# Patient Record
Sex: Male | Born: 1965 | Race: White | Hispanic: No | Marital: Married | Smoking: Never smoker
Health system: Southern US, Community
[De-identification: ages and names within clinical notes are randomized; demographics above are authoritative.]

## PROBLEM LIST (undated history)

## (undated) DIAGNOSIS — E785 Hyperlipidemia, unspecified: Secondary | ICD-10-CM

## (undated) DIAGNOSIS — I2510 Atherosclerotic heart disease of native coronary artery without angina pectoris: Secondary | ICD-10-CM

## (undated) HISTORY — PX: TONSILLECTOMY: SUR1361

## (undated) HISTORY — PX: APPENDECTOMY: SHX54

## (undated) HISTORY — PX: CARDIAC SURGERY: SHX584

---

## 2012-08-27 ENCOUNTER — Emergency Department: Payer: Self-pay | Admitting: Emergency Medicine

## 2012-09-07 ENCOUNTER — Ambulatory Visit: Payer: Self-pay

## 2013-11-30 IMAGING — CR DG KNEE 1-2V*L*
1 series · 2 of 2 positions shown · non-contrast
Comparison: none

REASON FOR EXAM: laceration over patella; pain; looking for foreign body
COMMENTS:

PROCEDURE:     DXR - DXR KNEE LEFT AP AND LATERAL  - August 27, 2012  [DATE]
RESULT:     Comparison: None.

[Series 1: t knee ap left · 0.14mm/px · 2 of 2 slices shown]
[im 1/2]
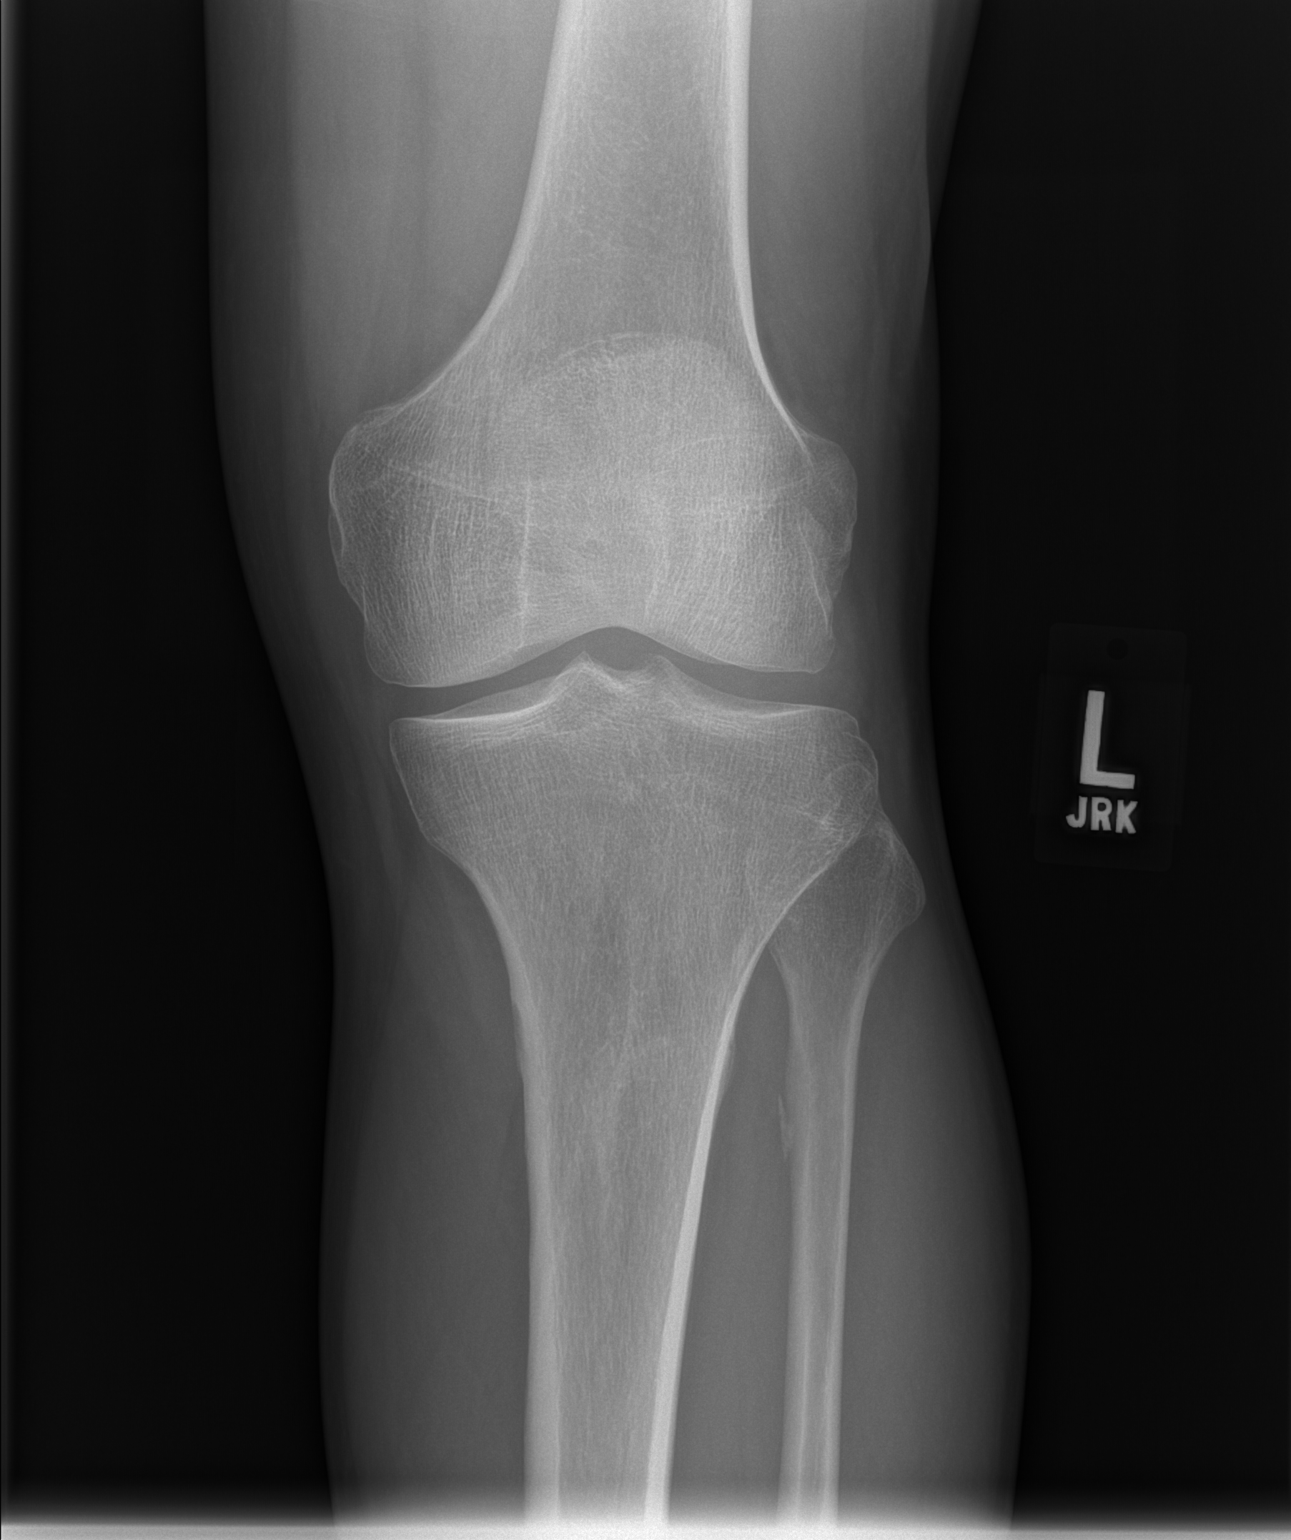
[im 2/2]
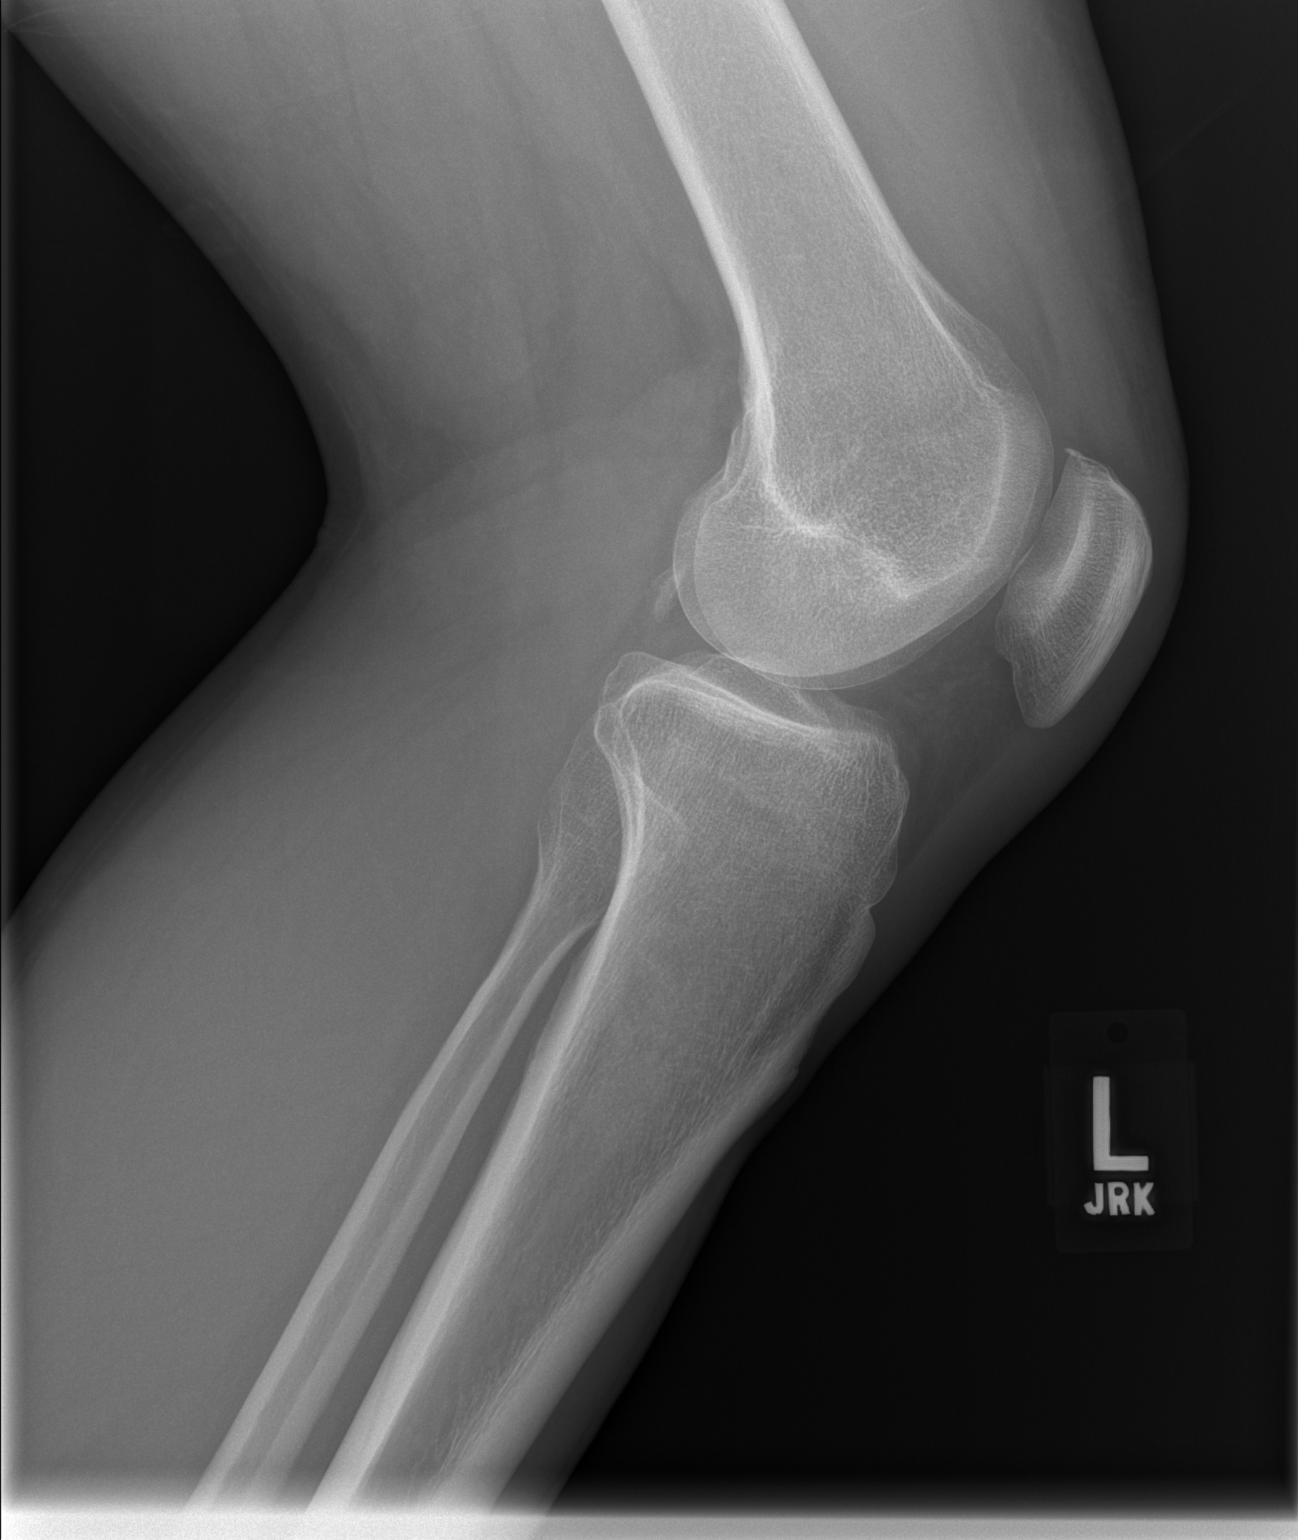

[2 of 2 positions shown; findings below may reference images not displayed]

FINDINGS: No acute fracture. No radiopaque foreign body.
IMPRESSION: No radiopaque foreign body.

[REDACTED]

## 2014-08-09 ENCOUNTER — Ambulatory Visit: Payer: Self-pay | Admitting: Gastroenterology

## 2017-07-21 ENCOUNTER — Ambulatory Visit
Admission: EM | Admit: 2017-07-21 | Discharge: 2017-07-21 | Disposition: A | Discharge status: Home / Self Care | Payer: 59 | Attending: Family Medicine | Admitting: Family Medicine

## 2017-07-21 ENCOUNTER — Other Ambulatory Visit: Payer: Self-pay

## 2017-07-21 DIAGNOSIS — R0602 Shortness of breath: Secondary | ICD-10-CM | POA: Diagnosis not present

## 2017-07-21 DIAGNOSIS — R079 Chest pain, unspecified: Secondary | ICD-10-CM | POA: Diagnosis not present

## 2017-07-21 DIAGNOSIS — I208 Other forms of angina pectoris: Secondary | ICD-10-CM

## 2017-07-21 MED ORDER — ASPIRIN 325 MG PO TABS: 325.0000 mg | ORAL_TABLET | Freq: Every day | ORAL | Status: DC

## 2017-07-21 MED ORDER — ISOSORBIDE MONONITRATE ER 30 MG PO TB24: 30.0000 mg | ORAL_TABLET | Freq: Every day | ORAL | 0 refills | Status: DC

## 2017-07-21 MED ORDER — ASPIRIN 325 MG PO TABS: 325.0000 mg | ORAL_TABLET | Freq: Once | ORAL | Status: DC

## 2017-07-21 MED ORDER — ASPIRIN EC 325 MG PO TBEC
325.0000 mg | DELAYED_RELEASE_TABLET | Freq: Once | ORAL | Status: AC
  Administered 2017-07-21: 325 mg via ORAL

## 2017-07-21 MED ORDER — NITROGLYCERIN 0.4 MG SL SUBL: 0.4000 mg | SUBLINGUAL_TABLET | SUBLINGUAL | 0 refills | Status: AC | PRN

## 2017-07-21 NOTE — ED Triage Notes (Signed)
Patient complains of chest pain that started around 1 week ago. Patient states that the pain comes and goes. Patient states that he has shortness of breath when he has the chest pain. Patient states that pain is in the center of chest and describes as a tightness.

## 2017-07-21 NOTE — Discharge Instructions (Signed)
Recommend follow up with Cardiology as soon as possible this week for further evaluation

## 2017-07-21 NOTE — ED Provider Notes (Signed)
[facility]    CSN: [id number] Arrival date & time: 07/21/17  1652     History   Chief Complaint Chief Complaint  Patient presents with  . Chest Pain    HPI Jerome Hart is a 51 y.o. male.   51 yo male with a c/o 1-1.5 weeks h/o intermittent episodes of mid sternal chest pain, described as "tightness", associated with exertion. States when he gets these episodes he also feels short of breath. Denies pain radiating to his neck, jaw or arm. However states pain will last 5-10 minutes a and will resolve with rest.  Denies cardiac risk factors.    The history is provided by the patient.    History reviewed. No pertinent past medical history.  There are no active problems to display for this patient.   Past Surgical History:  Procedure Laterality Date  . APPENDECTOMY    . TONSILLECTOMY         Home Medications    Prior to Admission medications   Medication Sig Start Date End Date Taking? Authorizing Provider  isosorbide mononitrate (IMDUR) 30 MG 24 hr tablet Take 1 tablet (30 mg total) by mouth daily. 07/21/17   Payton Mccallumonty, Jacquelyn Antony, MD  nitroGLYCERIN (NITROSTAT) 0.4 MG SL tablet Place 1 tablet (0.4 mg total) under the tongue every 5 (five) minutes as needed for chest pain. To ED if pain persists after 3 doses 07/21/17   Payton Mccallumonty, Abdishakur Gottschall, MD    Family History Family History  Problem Relation Age of Onset  . Cancer Father     Social History Social History   Tobacco Use  . Smoking status: Never Smoker  . Smokeless tobacco: Never Used  Substance Use Topics  . Alcohol use: Yes    Frequency: Never    Comment: rarely  . Drug use: No     Allergies   Compazine [prochlorperazine edisylate]   Review of Systems Review of Systems   Physical Exam Triage Vital Signs ED Triage Vitals [07/21/17 1702]  Enc Vitals Group     BP (!) 158/99     Pulse Rate 79     Resp 18     Temp 98.6 F (37 C)     Temp Source Oral     SpO2 100 %     Weight 170 lb  (77.1 kg)     Height 5\' 10"  (1.778 m)     Head Circumference      Peak Flow      Pain Score 5     Pain Loc      Pain Edu?      Excl. in GC?    No data found.  Updated Vital Signs BP (!) 158/99 (BP Location: Right Arm)   Pulse 79   Temp 98.6 F (37 C) (Oral)   Resp 18   Ht 5\' 10"  (1.778 m)   Wt 170 lb (77.1 kg)   SpO2 100%   BMI 24.39 kg/m   Visual Acuity Right Eye Distance:   Left Eye Distance:   Bilateral Distance:    Right Eye Near:   Left Eye Near:    Bilateral Near:     Physical Exam  Constitutional: He is oriented to person, place, and time. He appears well-developed and well-nourished. No distress.  HENT:  Head: Normocephalic and atraumatic.  Cardiovascular: Normal rate, regular rhythm, normal heart sounds and intact distal pulses.  No murmur heard. Pulmonary/Chest: Effort normal and breath sounds normal. No stridor. No respiratory distress. He has  no wheezes. He has no rales. He exhibits no tenderness.  Abdominal: Soft. Bowel sounds are normal. He exhibits no distension and no mass. There is no tenderness. There is no rebound and no guarding.  Neurological: He is alert and oriented to person, place, and time.  Skin: No rash noted. He is not diaphoretic.  Nursing note and vitals reviewed.    UC Treatments / Results  Labs (all labs ordered are listed, but only abnormal results are displayed) Labs Reviewed - No data to display  EKG  EKG Interpretation None       Radiology No results found.  Procedures ED EKG Date/Time: 07/21/2017 9:07 PM Performed by: Payton Mccallumonty, Wyett Narine, MD Authorized by: Payton Mccallumonty, Kaileah Shevchenko, MD   ECG reviewed by ED Physician in the absence of a cardiologist: yes   Previous ECG:    Previous ECG:  Unavailable Interpretation:    Interpretation: non-specific   Rate:    ECG rate assessment: normal   Rhythm:    Rhythm: sinus rhythm   Ectopy:    Ectopy: none   QRS:    QRS axis:  Normal Conduction:    Conduction: normal   ST  segments:    ST segments:  Normal T waves:    T waves: normal   Q waves:    Q waves:  V1 and aVR   (including critical care time)  Medications Ordered in UC Medications  aspirin EC tablet 325 mg (325 mg Oral Given 07/21/17 1800)     Initial Impression / Assessment and Plan / UC Course  I have reviewed the triage vital signs and the nursing notes.  Pertinent labs & imaging results that were available during my care of the patient were reviewed by me and considered in my medical decision making (see chart for details).       Final Clinical Impressions(s) / UC Diagnoses   Final diagnoses:  Stable angina pectoris [facility](HCC)    ED Discharge Orders        Ordered    isosorbide mononitrate (IMDUR) 30 MG 24 hr tablet  Daily     07/21/17 1754    nitroGLYCERIN (NITROSTAT) 0.4 MG SL tablet  Every 5 min PRN     07/21/17 1754     1. ekg result and diagnosis reviewed with patient and wife 2. rx as per orders above; reviewed possible side effects, interactions, risks and benefits  3. Recommend supportive treatment with rest 4. Recommend follow up with cardiologist this week for further evaluation and management; to ED if chest pain recurs and not resolved with SL NTG   Controlled Substance Prescriptions [place] Controlled Substance Registry consulted? Not Applicable   Payton Mccallumonty, Quint Chestnut, MD 07/21/17 2117

## 2017-07-28 DIAGNOSIS — I209 Angina pectoris, unspecified: Secondary | ICD-10-CM | POA: Insufficient documentation

## 2017-07-28 DIAGNOSIS — R9439 Abnormal result of other cardiovascular function study: Secondary | ICD-10-CM | POA: Insufficient documentation

## 2017-08-11 DIAGNOSIS — R030 Elevated blood-pressure reading, without diagnosis of hypertension: Secondary | ICD-10-CM | POA: Insufficient documentation

## 2017-08-11 DIAGNOSIS — I2510 Atherosclerotic heart disease of native coronary artery without angina pectoris: Secondary | ICD-10-CM | POA: Insufficient documentation

## 2017-08-12 ENCOUNTER — Ambulatory Visit: Payer: Self-pay | Admitting: Family Medicine

## 2017-08-16 ENCOUNTER — Encounter: Payer: 59 | Attending: Family Medicine | Admitting: *Deleted

## 2017-08-16 ENCOUNTER — Encounter: Payer: Self-pay | Admitting: *Deleted

## 2017-08-16 VITALS — Ht 69.2 in | Wt 170.9 lb

## 2017-08-16 DIAGNOSIS — Z955 Presence of coronary angioplasty implant and graft: Secondary | ICD-10-CM | POA: Diagnosis present

## 2017-08-16 DIAGNOSIS — Z7982 Long term (current) use of aspirin: Secondary | ICD-10-CM | POA: Diagnosis not present

## 2017-08-16 DIAGNOSIS — Z79899 Other long term (current) drug therapy: Secondary | ICD-10-CM | POA: Diagnosis not present

## 2017-08-16 DIAGNOSIS — Z7902 Long term (current) use of antithrombotics/antiplatelets: Secondary | ICD-10-CM | POA: Insufficient documentation

## 2017-08-16 NOTE — Progress Notes (Signed)
Cardiac Individual Treatment Plan  Patient Details  Name: Jerome Hart MRN: [id number] Date of Birth: 03/09/1966 Referring Provider:     Cardiac Rehab from 08/16/2017 in [facility] Cardiac and Pulmonary Rehab  Referring Provider  Joneen Roach MD      Initial Encounter Date:    Cardiac Rehab from 08/16/2017 in [facility] Cardiac and Pulmonary Rehab  Date  08/16/17  Referring Provider  Joneen Roach MD      Visit Diagnosis: Status post coronary artery stent placement  Patient's Home Medications on Admission:  Current Outpatient Medications:  .  aspirin EC 81 MG tablet, Take 81 mg by mouth., Disp: , Rfl:  .  atorvastatin (LIPITOR) 80 MG tablet, Take 80 mg by mouth., Disp: , Rfl:  .  clopidogrel (PLAVIX) 75 MG tablet, Take 75 mg by mouth., Disp: , Rfl:  .  nitroGLYCERIN (NITROSTAT) 0.4 MG SL tablet, Place 1 tablet (0.4 mg total) under the tongue every 5 (five) minutes as needed for chest pain. To ED if pain persists after 3 doses, Disp: 15 tablet, Rfl: 0 .  isosorbide mononitrate (IMDUR) 30 MG 24 hr tablet, Take 1 tablet (30 mg total) by mouth daily. (Patient not taking: Reported on 08/16/2017), Disp: 15 tablet, Rfl: 0  Past Medical History: History reviewed. No pertinent past medical history.  Tobacco Use: Social History   Tobacco Use  Smoking Status Never Smoker  Smokeless Tobacco Never Used    Labs: Recent Review Flowsheet Data    There is no flowsheet data to display.       Exercise Target Goals: Date: 08/16/17  Exercise Program Goal: Individual exercise prescription set with THRR, safety & activity barriers. Participant demonstrates ability to understand and report RPE using BORG scale, to self-measure pulse accurately, and to acknowledge the importance of the exercise prescription.  Exercise Prescription Goal: Starting with aerobic activity 30 plus minutes a day, 3 days per week for initial exercise prescription. Provide home exercise prescription and guidelines that participant  acknowledges understanding prior to discharge.  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 It is getting better and he wears a compression stocking on that arm. No limitations from MD noted.     Cardiac Risk Stratification  Moderate       6 Minute Walk: 6 Minute Walk    Row Name 08/16/17 1308         6 Minute Walk   Phase  Initial     Distance  1802 feet     Walk Time  6 minutes     # of Rest Breaks  0     MPH  3.41     METS  5.39     RPE  11     VO2 Peak  18.85     Symptoms  No     Resting HR  57 bpm     Resting BP  138/58     Resting Oxygen Saturation   99 %     Exercise Oxygen Saturation  during 6 min walk  99 %     Max Ex. HR  136 bpm     Max Ex. BP  146/64     2 Minute Post BP  132/60  Oxygen Initial Assessment:   Oxygen Re-Evaluation:   Oxygen Discharge (Final Oxygen Re-Evaluation):   Initial Exercise Prescription: Initial Exercise Prescription - 08/16/17 1300      Date of Initial Exercise RX and Referring Provider   Date  08/16/17    Referring Provider  Joneen Roach MD      Treadmill   MPH  3.4    Grade  3    Minutes  15    METs  5.01      NuStep   Level  4    SPM  80    Minutes  15    METs  3      REL-XR   Level  3    Speed  50    Minutes  15    METs  3      Prescription Details   Frequency (times per week)  3    Duration  Progress to 45 minutes of aerobic exercise without signs/symptoms of physical distress      Intensity   THRR 40-80% of Max Heartrate  101-145    Ratings of Perceived Exertion  11-13    Perceived Dyspnea  0-4      Progression   Progression  Continue to progress workloads to maintain intensity without signs/symptoms of physical distress.       Resistance Training   Training Prescription  Yes    Weight  4 lbs    Reps  10-15       Perform Capillary Blood Glucose checks as needed.  Exercise Prescription Changes: Exercise Prescription Changes    Row Name 08/16/17 1200             Response to Exercise   Blood Pressure (Admit)  138/58       Blood Pressure (Exercise)  146/66       Blood Pressure (Exit)  132/60       Heart Rate (Admit)  57 bpm       Heart Rate (Exercise)  136 bpm       Heart Rate (Exit)  67 bpm       Oxygen Saturation (Admit)  99 %       Oxygen Saturation (Exercise)  99 %       Rating of Perceived Exertion (Exercise)  11       Symptoms  none       Comments  walk test results          Exercise Comments:   Exercise Goals and Review: Exercise Goals    Row Name 08/16/17 1351             Exercise Goals   Increase Physical Activity  Yes       Intervention  Provide advice, education, support and counseling about physical activity/exercise needs.;Develop an individualized exercise prescription for aerobic and resistive training based on initial evaluation findings, risk stratification, comorbidities and participant's personal goals.       Expected Outcomes  Achievement of increased cardiorespiratory fitness and enhanced flexibility, muscular endurance and strength shown through measurements of functional capacity and personal statement of participant.       Increase Strength and Stamina  Yes       Intervention  Provide advice, education, support and counseling about physical activity/exercise needs.;Develop an individualized exercise prescription for aerobic and resistive training based on initial evaluation findings, risk stratification, comorbidities and participant's personal goals.       Expected Outcomes  Achievement of increased cardiorespiratory fitness and enhanced  flexibility, muscular endurance and strength shown through measurements of functional capacity and personal statement of  participant.       Able to understand and use rate of perceived exertion (RPE) scale  Yes       Intervention  Provide education and explanation on how to use RPE scale       Expected Outcomes  Short Term: Able to use RPE daily in rehab to express subjective intensity level;Long Term:  Able to use RPE to guide intensity level when exercising independently       Knowledge and understanding of Target Heart Rate Range (THRR)  Yes       Intervention  Provide education and explanation of THRR including how the numbers were predicted and where they are located for reference       Expected Outcomes  Short Term: Able to state/look up THRR;Long Term: Able to use THRR to govern intensity when exercising independently;Short Term: Able to use daily as guideline for intensity in rehab       Able to check pulse independently  Yes       Intervention  Provide education and demonstration on how to check pulse in carotid and radial arteries.;Review the importance of being able to check your own pulse for safety during independent exercise       Expected Outcomes  Short Term: Able to explain why pulse checking is important during independent exercise;Long Term: Able to check pulse independently and accurately       Understanding of Exercise Prescription  Yes       Intervention  Provide education, explanation, and written materials on patient's individual exercise prescription       Expected Outcomes  Short Term: Able to explain program exercise prescription;Long Term: Able to explain home exercise prescription to exercise independently          Exercise Goals Re-Evaluation :   Discharge Exercise Prescription (Final Exercise Prescription Changes): Exercise Prescription Changes - 08/16/17 1200      Response to Exercise   Blood Pressure (Admit)  138/58    Blood Pressure (Exercise)  146/66    Blood Pressure (Exit)  132/60    Heart Rate (Admit)  57 bpm    Heart Rate (Exercise)  136 bpm    Heart Rate (Exit)  67  bpm    Oxygen Saturation (Admit)  99 %    Oxygen Saturation (Exercise)  99 %    Rating of Perceived Exertion (Exercise)  11    Symptoms  none    Comments  walk test results       Nutrition:  Target Goals: Understanding of nutrition guidelines, daily intake of sodium 1500mg , cholesterol 200mg , calories 30% from fat and 7% or less from saturated fats, daily to have 5 or more servings of fruits and vegetables.  Biometrics: Pre Biometrics - 08/16/17 1351      Pre Biometrics   Height  5' 9.2" (1.758 m)    Weight  170 lb 14.4 oz (77.5 kg)    Waist Circumference  36 inches    Hip Circumference  39 inches    Waist to Hip Ratio  0.92 %    BMI (Calculated)  25.08    Single Leg Stand  30 seconds        Nutrition Therapy Plan and Nutrition Goals: Nutrition Therapy & Goals - 08/16/17 1241      Intervention Plan   Intervention  Prescribe, educate and counsel regarding individualized specific dietary modifications aiming towards targeted  core components such as weight, hypertension, lipid management, diabetes, heart failure and other comorbidities.;Nutrition handout(s) given to patient.    Expected Outcomes  Short Term Goal: Understand basic principles of dietary content, such as calories, fat, sodium, cholesterol and nutrients.;Short Term Goal: A plan has been developed with personal nutrition goals set during dietitian appointment.;Long Term Goal: Adherence to prescribed nutrition plan.       Nutrition Discharge: Rate Your Plate Scores: Nutrition Assessments - 08/16/17 1241      MEDFICTS Scores   Pre Score  42       Nutrition Goals Re-Evaluation:   Nutrition Goals Discharge (Final Nutrition Goals Re-Evaluation):   Psychosocial: Target Goals: Acknowledge presence or absence of significant depression and/or stress, maximize coping skills, provide positive support system. Participant is able to verbalize types and ability to use techniques and skills needed for reducing stress  and depression.   Initial Review & Psychosocial Screening: Initial Psych Review & Screening - 08/16/17 1242      Initial Review   Current issues with  Current Stress Concerns    Source of Stress Concerns  Unable to perform yard/household activities    Comments  Is eager to return to work and be able to do things around the house normally.       Family Dynamics   Good Support System?  Yes wife and friends and family      Barriers   Psychosocial barriers to participate in program  There are no identifiable barriers or psychosocial needs.;The patient should benefit from training in stress management and relaxation.      Screening Interventions   Interventions  Yes;Encouraged to exercise;Program counselor consult;Provide feedback about the scores to participant;To provide support and resources with identified psychosocial needs    Expected Outcomes  Short Term goal: Utilizing psychosocial counselor, staff and physician to assist with identification of specific Stressors or current issues interfering with healing process. Setting desired goal for each stressor or current issue identified.;Long Term Goal: Stressors or current issues are controlled or eliminated.;Short Term goal: Identification and review with participant of any Quality of Life or Depression concerns found by scoring the questionnaire.;Long Term goal: The participant improves quality of Life and PHQ9 Scores as seen by post scores and/or verbalization of changes       Quality of Life Scores:  Quality of Life - 08/16/17 1058      Quality of Life Scores   Health/Function Pre  26.08 %    Socioeconomic Pre  23.25 %    Psych/Spiritual Pre  23.57 %    Family Pre  24.9 %    GLOBAL Pre  24.68 %       PHQ-9: Recent Review Flowsheet Data    Depression screen [facility] 2/9 08/16/2017   Decreased Interest 0   Down, Depressed, Hopeless 0   PHQ - 2 Score 0   Altered sleeping 1   Tired, decreased energy 0   Change in appetite 0   Feeling  bad or failure about yourself  1   Trouble concentrating 0   Moving slowly or fidgety/restless 0   Suicidal thoughts 0   PHQ-9 Score 2     Interpretation of Total Score  Total Score Depression Severity:  1-4 = Minimal depression, 5-9 = Mild depression, 10-14 = Moderate depression, 15-19 = Moderately severe depression, 20-27 = Severe depression   Psychosocial Evaluation and Intervention:   Psychosocial Re-Evaluation:   Psychosocial Discharge (Final Psychosocial Re-Evaluation):   Vocational Rehabilitation: Provide vocational rehab  assistance to qualifying candidates.   Vocational Rehab Evaluation & Intervention: Vocational Rehab - 08/16/17 1251      Initial Vocational Rehab Evaluation & Intervention   Assessment shows need for Vocational Rehabilitation  No       Education: Education Goals: Education classes will be provided on a variety of topics geared toward better understanding of heart health and risk factor modification. Participant will state understanding/return demonstration of topics presented as noted by education test scores.  Learning Barriers/Preferences: Learning Barriers/Preferences - 08/16/17 1250      Learning Barriers/Preferences   Learning Barriers  Sight Needs reading glasses to read forms    Learning Preferences  Skilled Demonstration;Verbal Instruction       Education Topics: General Nutrition Guidelines/Fats and Fiber: -Group instruction provided by verbal, written material, models and posters to present the general guidelines for heart healthy nutrition. Gives an explanation and review of dietary fats and fiber.   Controlling Sodium/Reading Food Labels: -Group verbal and written material supporting the discussion of sodium use in heart healthy nutrition. Review and explanation with models, verbal and written materials for utilization of the food label.   Exercise Physiology & Risk Factors: - Group verbal and written instruction with models to  review the exercise physiology of the cardiovascular system and associated critical values. Details cardiovascular disease risk factors and the goals associated with each risk factor.   Aerobic Exercise & Resistance Training: - Gives group verbal and written discussion on the health impact of inactivity. On the components of aerobic and resistive training programs and the benefits of this training and how to safely progress through these programs.   Flexibility, Balance, General Exercise Guidelines: - Provides group verbal and written instruction on the benefits of flexibility and balance training programs. Provides general exercise guidelines with specific guidelines to those with heart or lung disease. Demonstration and skill practice provided.   Stress Management: - Provides group verbal and written instruction about the health risks of elevated stress, cause of high stress, and healthy ways to reduce stress.   Depression: - Provides group verbal and written instruction on the correlation between heart/lung disease and depressed mood, treatment options, and the stigmas associated with seeking treatment.   Anatomy & Physiology of the Heart: - Group verbal and written instruction and models provide basic cardiac anatomy and physiology, with the coronary electrical and arterial systems. Review of: AMI, Angina, Valve disease, Heart Failure, Cardiac Arrhythmia, Pacemakers, and the ICD.   Cardiac Procedures: - Group verbal and written instruction to review commonly prescribed medications for heart disease. Reviews the medication, class of the drug, and side effects. Includes the steps to properly store meds and maintain the prescription regimen. (beta blockers and nitrates)   Cardiac Medications I: - Group verbal and written instruction to review commonly prescribed medications for heart disease. Reviews the medication, class of the drug, and side effects. Includes the steps to properly  store meds and maintain the prescription regimen.   Cardiac Medications II: -Group verbal and written instruction to review commonly prescribed medications for heart disease. Reviews the medication, class of the drug, and side effects. (all other drug classes)    Go Sex-Intimacy & Heart Disease, Get SMART - Goal Setting: - Group verbal and written instruction through game format to discuss heart disease and the return to sexual intimacy. Provides group verbal and written material to discuss and apply goal setting through the application of the S.M.A.R.T. Method.   Other Matters of the Heart: - Provides group  verbal, written materials and models to describe Heart Failure, Angina, Valve Disease, Peripheral Artery Disease, and Diabetes in the realm of heart disease. Includes description of the disease process and treatment options available to the cardiac patient.   Exercise & Equipment Safety: - Individual verbal instruction and demonstration of equipment use and safety with use of the equipment.   Card[facility] Cardiac [facility]  Date  08/16/17  Educator  [place]  Instruction Review Code  1- Verbalizes Understanding      Infection Prevention: - Provides verbal and written material to individual with discussion of infection control including proper hand washing and proper equipment cleaning during exercise session.   [facility]  Date  08/16/17  Educator  [place]  Instruction Review Code  1- Verbalizes Understanding      Falls Prevention: - Provides verbal and written material to individual with discussion of falls prevention and safety.   [facility] Card[facility] Pul[facility] Re[facility]  Date  08/16/17  Educator  KS  Instruction Review Code  1- Verbalizes Understanding      Diabetes: - Individual verbal and written instruction to review signs/symptoms of diabetes, desired ranges of  glucose level fasting, after meals and with exercise. Acknowledge that pre and post exercise glucose checks will be done for 3 sessions at entry of program.   Other: -Provides group and verbal instruction on various topics (see comments)    Knowledge Questionnaire Score: Knowledge Questionnaire Score - 08/16/17 1250      Knowledge Questionnaire Score   Pre Score  22/28 Correct responses reviewed with patient. He verbalized understanding.       Core Components/Risk Factors/Patient Goals at Admission: Personal Goals and Risk Factors at Admission - 08/16/17 1228      Core Components/Risk Factors/Patient Goals on Admission    Weight Management  Weight Maintenance;Yes    Intervention  Weight Management: Provide education and appropriate resources to help participant work on and attain dietary goals.;Weight Management: Develop a combined nutrition and exercise program designed to reach desired caloric intake, while maintaining appropriate intake of nutrient and fiber, sodium and fats, and appropriate energy expenditure required for the weight goal.    Admit Weight  170 lb 14.4 oz (77.5 kg)    Goal Weight: Short Term  168 lb (76.2 kg)    Goal Weight: Long Term  165 lb (74.8 kg)    Expected Outcomes  Weight Maintenance: Understanding of the daily nutrition guidelines, which includes 25-35% calories from fat, 7% or less cal from saturated fats, less than 200mg  cholesterol, less than 1.5gm of sodium, & 5 or more servings of fruits and vegetables daily;Short Term: Continue to assess and modify interventions until short term weight is achieved    Improve shortness of breath with ADL's  Yes states he has always gotten SOB since younger if he did anything more exertional than walking. He would like to build up endurance to tolerate physical activity more.     Intervention  Provide education, individualized exercise plan and daily activity instruction to help decrease symptoms of SOB with activities of  daily living.    Expected Outcomes  Short Term: Achieves a reduction of symptoms when performing activities of daily living.    Hypertension  Yes    Intervention  Provide education on lifestyle modifcations including regular physical activity/exercise, weight management, moderate sodium restriction and increased consumption of fresh fruit, vegetables, and low  fat dairy, alcohol moderation, and smoking cessation.;Monitor prescription use compliance.    Expected Outcomes  Short Term: Continued assessment and intervention until BP is < 140/83mm HG in hypertensive participants. < 130/44mm HG in hypertensive participants with diabetes, heart failure or chronic kidney disease.;Long Term: Maintenance of blood pressure at goal levels.    Lipids  Yes    Intervention  Provide education and support for participant on nutrition & aerobic/resistive exercise along with prescribed medications to achieve LDL 70mg , HDL >40mg .    Expected Outcomes  Short Term: Participant states understanding of desired cholesterol values and is compliant with medications prescribed. Participant is following exercise prescription and nutrition guidelines.;Long Term: Cholesterol controlled with medications as prescribed, with individualized exercise RX and with personalized nutrition plan. Value goals: LDL < 70mg , HDL > 40 mg.    Personal Goal Other  Yes    Personal Goal  Develop a consistent exercise routine to continue outside of the program.     Intervention  Discuss home exercises with patient and encourage him to join a gym.    Expected Outcomes  Patient will continue with exercise at home or at a gym outside of days he comes to class and will continue once he completes the program.        Core Components/Risk Factors/Patient Goals Review:    Core Components/Risk Factors/Patient Goals at Discharge (Final Review):    ITP Comments: ITP Comments    Row Name 08/16/17 1225           ITP Comments  Medical Review Completed;  initial ITP created. Diagnosis Documentation can be found in [facility] encounter dated 08/12/2017.          Comments: Initial ITP

## 2017-08-16 NOTE — Patient Instructions (Signed)
Patient Instructions  Patient Details  Name: Jerome Hart MRN: [id number] Date of Birth: 10/25/1965 Referring Provider:  Vernard Gambles, MD  Below are the personal goals you chose as well as exercise and nutrition goals. Our goal is to help you keep on track towards obtaining and maintaining your goals. We will be discussing your progress on these goals with you throughout the program.  Initial Exercise Prescription: Initial Exercise Prescription - 08/16/17 1300      Date of Initial Exercise RX and Referring Provider   Date  08/16/17    Referring Provider  Joneen Roach MD      Treadmill   MPH  3.4    Grade  3    Minutes  15    METs  5.01      NuStep   Level  4    SPM  80    Minutes  15    METs  3      REL-XR   Level  3    Speed  50    Minutes  15    METs  3      Prescription Details   Frequency (times per week)  3    Duration  Progress to 45 minutes of aerobic exercise without signs/symptoms of physical distress      Intensity   THRR 40-80% of Max Heartrate  101-145    Ratings of Perceived Exertion  11-13    Perceived Dyspnea  0-4      Progression   Progression  Continue to progress workloads to maintain intensity without signs/symptoms of physical distress.      Resistance Training   Training Prescription  Yes    Weight  4 lbs    Reps  10-15       Exercise Goals: Frequency: Be able to perform aerobic exercise three times per week working toward 3-5 days per week.  Intensity: Work with a perceived exertion of 11 (fairly light) - 15 (hard) as tolerated. Follow your new exercise prescription and watch for changes in prescription as you progress with the program. Changes will be reviewed with you when they are made.  Duration: You should be able to do 30 minutes of continuous aerobic exercise in addition to a 5 minute warm-up and a 5 minute cool-down routine.  Nutrition Goals: Your personal nutrition goals will be established when you do your nutrition analysis  with the dietician.  The following are nutrition guidelines to follow: Cholesterol < 200mg /day Sodium < 1500mg /day Fiber: Men over 50 yrs - 30 grams per day  Personal Goals: Personal Goals and Risk Factors at Admission - 08/16/17 1228      Core Components/Risk Factors/Patient Goals on Admission    Weight Management  Weight Maintenance;Yes    Intervention  Weight Management: Provide education and appropriate resources to help participant work on and attain dietary goals.;Weight Management: Develop a combined nutrition and exercise program designed to reach desired caloric intake, while maintaining appropriate intake of nutrient and fiber, sodium and fats, and appropriate energy expenditure required for the weight goal.    Admit Weight  170 lb 14.4 oz (77.5 kg)    Goal Weight: Short Term  168 lb (76.2 kg)    Goal Weight: Long Term  165 lb (74.8 kg)    Expected Outcomes  Weight Maintenance: Understanding of the daily nutrition guidelines, which includes 25-35% calories from fat, 7% or less cal from saturated fats, less than 200mg  cholesterol, less than 1.5gm of sodium, &  5 or more servings of fruits and vegetables daily;Short Term: Continue to assess and modify interventions until short term weight is achieved    Improve shortness of breath with ADL's  Yes states he has always gotten SOB since younger if he did anything more exertional than walking. He would like to build up endurance to tolerate physical activity more.     Intervention  Provide education, individualized exercise plan and daily activity instruction to help decrease symptoms of SOB with activities of daily living.    Expected Outcomes  Short Term: Achieves a reduction of symptoms when performing activities of daily living.    Hypertension  Yes    Intervention  Provide education on lifestyle modifcations including regular physical activity/exercise, weight management, moderate sodium restriction and increased consumption of fresh  fruit, vegetables, and low fat dairy, alcohol moderation, and smoking cessation.;Monitor prescription use compliance.    Expected Outcomes  Short Term: Continued assessment and intervention until BP is < 140/81mm HG in hypertensive participants. < 130/52mm HG in hypertensive participants with diabetes, heart failure or chronic kidney disease.;Long Term: Maintenance of blood pressure at goal levels.    Lipids  Yes    Intervention  Provide education and support for participant on nutrition & aerobic/resistive exercise along with prescribed medications to achieve LDL 70mg , HDL >40mg .    Expected Outcomes  Short Term: Participant states understanding of desired cholesterol values and is compliant with medications prescribed. Participant is following exercise prescription and nutrition guidelines.;Long Term: Cholesterol controlled with medications as prescribed, with individualized exercise RX and with personalized nutrition plan. Value goals: LDL < 70mg , HDL > 40 mg.    Personal Goal Other  Yes    Personal Goal  Develop a consistent exercise routine to continue outside of the program.     Intervention  Discuss home exercises with patient and encourage him to join a gym.    Expected Outcomes  Patient will continue with exercise at home or at a gym outside of days he comes to class and will continue once he completes the program.        Tobacco Use Initial Evaluation: Social History   Tobacco Use  Smoking Status Never Smoker  Smokeless Tobacco Never Used    Exercise Goals and Review: Exercise Goals    Row Name 08/16/17 1351             Exercise Goals   Increase Physical Activity  Yes       Intervention  Provide advice, education, support and counseling about physical activity/exercise needs.;Develop an individualized exercise prescription for aerobic and resistive training based on initial evaluation findings, risk stratification, comorbidities and participant's personal goals.        Expected Outcomes  Achievement of increased cardiorespiratory fitness and enhanced flexibility, muscular endurance and strength shown through measurements of functional capacity and personal statement of participant.       Increase Strength and Stamina  Yes       Intervention  Provide advice, education, support and counseling about physical activity/exercise needs.;Develop an individualized exercise prescription for aerobic and resistive training based on initial evaluation findings, risk stratification, comorbidities and participant's personal goals.       Expected Outcomes  Achievement of increased cardiorespiratory fitness and enhanced flexibility, muscular endurance and strength shown through measurements of functional capacity and personal statement of participant.       Able to understand and use rate of perceived exertion (RPE) scale  Yes       Intervention  Provide education and explanation on how to use RPE scale       Expected Outcomes  Short Term: Able to use RPE daily in rehab to express subjective intensity level;Long Term:  Able to use RPE to guide intensity level when exercising independently       Knowledge and understanding of Target Heart Rate Range (THRR)  Yes       Intervention  Provide education and explanation of THRR including how the numbers were predicted and where they are located for reference       Expected Outcomes  Short Term: Able to state/look up THRR;Long Term: Able to use THRR to govern intensity when exercising independently;Short Term: Able to use daily as guideline for intensity in rehab       Able to check pulse independently  Yes       Intervention  Provide education and demonstration on how to check pulse in carotid and radial arteries.;Review the importance of being able to check your own pulse for safety during independent exercise       Expected Outcomes  Short Term: Able to explain why pulse checking is important during independent exercise;Long Term: Able to  check pulse independently and accurately       Understanding of Exercise Prescription  Yes       Intervention  Provide education, explanation, and written materials on patient's individual exercise prescription       Expected Outcomes  Short Term: Able to explain program exercise prescription;Long Term: Able to explain home exercise prescription to exercise independently          Copy of goals given to participant.

## 2017-08-19 DIAGNOSIS — Z955 Presence of coronary angioplasty implant and graft: Secondary | ICD-10-CM

## 2017-08-19 NOTE — Progress Notes (Signed)
Daily Session Note  Patient Details  Name: Jerome Hart MRN: [id number] Date of Birth: 1965-12-04 Referring Provider:     Cardiac Rehab from 08/16/2017 in [facility] Cardiac and Pulmonary Rehab  Referring Provider  Kandis Cocking MD      Encounter Date: 08/19/2017  Check In: Session Check In - 08/19/17 1651      Check-In   Location  [facility]-Cardiac & Pulmonary Rehab    Staff Present  Earlean Shawl, BS, ACSM CEP, Exercise Physiologist;Meredith Sherryll Burger, RN BSN;Temple Ewart Flavia Shipper    Supervising physician immediately available to respond to emergencies  See telemetry face sheet for immediately available ER MD    Medication changes reported      No    Fall or balance concerns reported     No    Warm-up and Cool-down  Performed on first and last piece of equipment    Resistance Training Performed  Yes      Pain Assessment   Currently in Pain?  No/denies    Multiple Pain Sites  No          Social History   Tobacco Use  Smoking Status Never Smoker  Smokeless Tobacco Never Used    Goals Met:  Independence with exercise equipment Exercise tolerated well No report of cardiac concerns or symptoms Strength training completed today  Goals Unmet:  Not Applicable  Comments: First full day of exercise!  Patient was oriented to gym and equipment including functions, settings, policies, and procedures.  Patient's individual exercise prescription and treatment plan were reviewed.  All starting workloads were established based on the results of the 6 minute walk test done at initial orientation visit.  The plan for exercise progression was also introduced and progression will be customized based on patient's performance and goals.   Dr. Emily Filbert is Medical Director for [place] and [name].

## 2017-08-23 DIAGNOSIS — Z955 Presence of coronary angioplasty implant and graft: Secondary | ICD-10-CM

## 2017-08-23 NOTE — Progress Notes (Signed)
Daily Session Note  Patient Details  Name: Jerome Hart MRN: [id number] Date of Birth: [birth year] Referring Provider:     Cardiac Rehab from 08/16/2017 in [facility] Cardiac and Pulmonary Rehab  Referring Provider  Kandis Cocking MD      Encounter Date: 08/23/2017  Check In: Session Check In - 08/23/17 1724      Check-In   Location  [facility]-Cardiac & Pulmonary Rehab    Staff Present  Earlean Shawl, BS, ACSM CEP, Exercise Physiologist;Amanda Oletta Darter, BA, ACSM CEP, Exercise Physiologist;Carroll Enterkin, RN, BSN    Supervising physician immediately available to respond to emergencies  See telemetry face sheet for immediately available ER MD    Medication changes reported      No    Fall or balance concerns reported     No    Warm-up and Cool-down  Performed on first and last piece of equipment    Resistance Training Performed  Yes    VAD Patient?  No      Pain Assessment   Currently in Pain?  No/denies    Multiple Pain Sites  No          Social History   Tobacco Use  Smoking Status Never Smoker  Smokeless Tobacco Never Used    Goals Met:  Independence with exercise equipment Exercise tolerated well No report of cardiac concerns or symptoms Strength training completed today  Goals Unmet:  Not Applicable  Comments: Pt able to follow exercise prescription today without complaint.  Will continue to monitor for progression.    Dr. Emily Filbert is Medical Director for [place] and [name].

## 2017-08-25 ENCOUNTER — Encounter: Payer: 59 | Admitting: *Deleted

## 2017-08-25 DIAGNOSIS — Z955 Presence of coronary angioplasty implant and graft: Secondary | ICD-10-CM

## 2017-08-25 NOTE — Progress Notes (Signed)
Daily Session Note  Patient Details  Name: Jerome Hart MRN: [id number] Date of Birth: [birth year] Referring Provider:     Cardiac Rehab from 08/16/2017 in [facility] Cardiac and Pulmonary Rehab  Referring Provider  Kandis Cocking MD      Encounter Date: 08/25/2017  Check In: Session Check In - 08/25/17 1722      Check-In   Location  [facility]-Cardiac & Pulmonary Rehab    Staff Present  Renita Papa, RN Vickki Hearing, BA, ACSM CEP, Exercise Physiologist;Carroll Enterkin, RN, BSN    Supervising physician immediately available to respond to emergencies  See telemetry face sheet for immediately available ER MD    Medication changes reported      No    Fall or balance concerns reported     No    Warm-up and Cool-down  Performed on first and last piece of equipment    Resistance Training Performed  Yes    VAD Patient?  No      Pain Assessment   Currently in Pain?  No/denies          Social History   Tobacco Use  Smoking Status Never Smoker  Smokeless Tobacco Never Used    Goals Met:  Independence with exercise equipment Exercise tolerated well No report of cardiac concerns or symptoms Strength training completed today  Goals Unmet:  Not Applicable  Comments: Pt able to follow exercise prescription today without complaint.  Will continue to monitor for progression.    Dr. Emily Filbert is Medical Director for [place] and [name].

## 2017-08-26 ENCOUNTER — Telehealth: Payer: Self-pay

## 2017-08-26 NOTE — Telephone Encounter (Signed)
Trey PaulaJeff called to say he would not be in class today due to his [facility].

## 2017-08-30 DIAGNOSIS — Z955 Presence of coronary angioplasty implant and graft: Secondary | ICD-10-CM | POA: Diagnosis not present

## 2017-08-30 NOTE — Progress Notes (Signed)
Daily Session Note  Patient Details  Name: OLUWATOBI VISSER MRN: [id number] Date of Birth: March 08, 1966 Referring Provider:     Cardiac Rehab from 08/16/2017 in [facility] Cardiac and Pulmonary Rehab  Referring Provider  Kandis Cocking MD      Encounter Date: 08/30/2017  Check In: Session Check In - 08/30/17 1714      Check-In   Location  [facility]-Cardiac & Pulmonary Rehab    Staff Present  [place], BS, [place];Nada Maclachlan, BA, ACSM CEP, Exercise Physiologist;Carroll Enterkin, RN, BSN    Supervising physician immediately available to respond to emergencies  See telemetry face sheet for immediately available ER MD    Medication changes reported      No    Fall or balance concerns reported     No    Warm-up and Cool-down  Performed on first and last piece of equipment    Resistance Training Performed  Yes    VAD Patient?  No      Pain Assessment   Currently in Pain?  No/denies    Multiple Pain Sites  No          Social History   Tobacco Use  Smoking Status Never Smoker  Smokeless Tobacco Never Used    Goals Met:  Independence with exercise equipment Exercise tolerated well No report of cardiac concerns or symptoms Strength training completed today  Goals Unmet:  Not Applicable  Comments: Pt able to follow exercise prescription today without complaint.  Will continue to monitor for progression.    Dr. Emily Filbert is Medical Director for [place] and [name].

## 2017-09-01 DIAGNOSIS — Z955 Presence of coronary angioplasty implant and graft: Secondary | ICD-10-CM | POA: Diagnosis not present

## 2017-09-01 NOTE — Progress Notes (Signed)
Daily Session Note  Patient Details  Name: REAL CONA MRN: [id number] Date of Birth: March 04, 1966 Referring Provider:     Cardiac Rehab from 08/16/2017 in [facility] Cardiac and Pulmonary Rehab  Referring Provider  Kandis Cocking MD      Encounter Date: 09/01/2017  Check In: Session Check In - 09/01/17 1646      Check-In   Location  [facility]-Cardiac & Pulmonary Rehab    Staff Present  Joellyn Rued, BS, PEC;Meredith Merrick, RN Vickki Hearing, BA, ACSM CEP, Exercise Physiologist    Supervising physician immediately available to respond to emergencies  See telemetry face sheet for immediately available ER MD    Medication changes reported      No    Fall or balance concerns reported     No    Warm-up and Cool-down  Performed on first and last piece of equipment    Resistance Training Performed  Yes    VAD Patient?  No      Pain Assessment   Currently in Pain?  No/denies    Multiple Pain Sites  No        Exercise Prescription Changes - 09/01/17 1700      Home Exercise Plan   Plans to continue exercise at  Home (comment) walk    Frequency  Add 1 additional day to program exercise sessions.    Initial Home Exercises Provided  09/01/17       Social History   Tobacco Use  Smoking Status Never Smoker  Smokeless Tobacco Never Used    Goals Met:  Independence with exercise equipment Exercise tolerated well No report of cardiac concerns or symptoms Strength training completed today  Goals Unmet:  Not Applicable  Comments: Reviewed home exercise with pt today.  Pt plans to walk for exercise.  Reviewed THR, pulse, RPE, sign and symptoms, NTG use, and when to call 911 or MD.  Also discussed weather considerations and indoor options.  Pt voiced understanding.    Dr. Emily Filbert is Medical Director for [place] and [name].

## 2017-09-02 DIAGNOSIS — Z955 Presence of coronary angioplasty implant and graft: Secondary | ICD-10-CM | POA: Diagnosis not present

## 2017-09-02 NOTE — Progress Notes (Signed)
Daily Session Note  Patient Details  Name: Jerome Hart MRN: [id number] Date of Birth: Aug 04, 1966 Referring Provider:     Cardiac Rehab from 08/16/2017 in [facility] Cardiac and Pulmonary Rehab  Referring Provider  Kandis Cocking MD      Encounter Date: 09/02/2017  Check In: Session Check In - 09/02/17 1646      Check-In   Location  [facility]-Cardiac & Pulmonary Rehab    Staff Present  Earlean Shawl, BS, ACSM CEP, Exercise Physiologist;Meredith Sherryll Burger, RN BSN;Savahna Casados Flavia Shipper    Supervising physician immediately available to respond to emergencies  See telemetry face sheet for immediately available ER MD    Medication changes reported      No    Fall or balance concerns reported     No    Warm-up and Cool-down  Performed on first and last piece of equipment    Resistance Training Performed  Yes    VAD Patient?  No      Pain Assessment   Currently in Pain?  No/denies        Exercise Prescription Changes - 09/01/17 1700      Home Exercise Plan   Plans to continue exercise at  Home (comment) walk    Frequency  Add 1 additional day to program exercise sessions.    Initial Home Exercises Provided  09/01/17       Social History   Tobacco Use  Smoking Status Never Smoker  Smokeless Tobacco Never Used    Goals Met:  Independence with exercise equipment Exercise tolerated well No report of cardiac concerns or symptoms Strength training completed today  Goals Unmet:  Not Applicable  Comments: Pt able to follow exercise prescription today without complaint.  Will continue to monitor for progression.   Dr. Emily Filbert is Medical Director for [place] and [name].

## 2017-09-06 ENCOUNTER — Encounter: Payer: 59 | Admitting: *Deleted

## 2017-09-06 DIAGNOSIS — Z955 Presence of coronary angioplasty implant and graft: Secondary | ICD-10-CM | POA: Diagnosis not present

## 2017-09-06 NOTE — Progress Notes (Signed)
Daily Session Note  Patient Details  Name: Jerome Hart MRN: [id number] Date of Birth: 03-16-1966 Referring Provider:     Cardiac Rehab from 08/16/2017 in [facility] Cardiac and Pulmonary Rehab  Referring Provider  Kandis Cocking MD      Encounter Date: 09/06/2017  Check In: Session Check In - 09/06/17 1637      Check-In   Location  [facility]-Cardiac & Pulmonary Rehab    Staff Present  Earlean Shawl, BS, ACSM CEP, Exercise Physiologist;Amanda Oletta Darter, BA, ACSM CEP, Exercise Physiologist;Carroll Enterkin, RN, BSN    Supervising physician immediately available to respond to emergencies  See telemetry face sheet for immediately available ER MD    Medication changes reported      No    Fall or balance concerns reported     No    Warm-up and Cool-down  Performed on first and last piece of equipment    Resistance Training Performed  Yes    VAD Patient?  No      Pain Assessment   Currently in Pain?  No/denies    Multiple Pain Sites  No          Social History   Tobacco Use  Smoking Status Never Smoker  Smokeless Tobacco Never Used    Goals Met:  Independence with exercise equipment Exercise tolerated well No report of cardiac concerns or symptoms Strength training completed today  Goals Unmet:  Not Applicable  Comments: Pt able to follow exercise prescription today without complaint.  Will continue to monitor for progression.    Dr. Emily Filbert is Medical Director for [place] and [name].

## 2017-09-08 ENCOUNTER — Encounter: Payer: Self-pay | Admitting: *Deleted

## 2017-09-08 DIAGNOSIS — Z955 Presence of coronary angioplasty implant and graft: Secondary | ICD-10-CM | POA: Diagnosis not present

## 2017-09-08 NOTE — Progress Notes (Signed)
Cardiac Individual Treatment Plan  Patient Details  Name: DELVECCHIO MADOLE MRN: [id number] Date of Birth: 01-Apr-1966 Referring Provider:     Cardiac Rehab from 08/16/2017 in [facility] Cardiac and Pulmonary Rehab  Referring Provider  Kandis Cocking MD      Initial Encounter Date:    Cardiac Rehab from 08/16/2017 in [facility] Cardiac and Pulmonary Rehab  Date  08/16/17  Referring Provider  Kandis Cocking MD      Visit Diagnosis: Status post coronary artery stent placement  Patient's Home Medications on Admission:  Current Outpatient Medications:  .  aspirin EC 81 MG tablet, Take 81 mg by mouth., Disp: , Rfl:  .  atorvastatin (LIPITOR) 80 MG tablet, Take 80 mg by mouth., Disp: , Rfl:  .  isosorbide mononitrate (IMDUR) 30 MG 24 hr tablet, Take 1 tablet (30 mg total) by mouth daily. (Patient not taking: Reported on 08/16/2017), Disp: 15 tablet, Rfl: 0 .  nitroGLYCERIN (NITROSTAT) 0.4 MG SL tablet, Place 1 tablet (0.4 mg total) under the tongue every 5 (five) minutes as needed for chest pain. To ED if pain persists after 3 doses, Disp: 15 tablet, Rfl: 0  Past Medical History: No past medical history on file.  Tobacco Use: Social History   Tobacco Use  Smoking Status Never Smoker  Smokeless Tobacco Never Used    Labs: Recent Review Flowsheet Data    There is no flowsheet data to display.       Exercise Target Goals:    Exercise Program Goal: Individual exercise prescription set using results from initial 6 min walk test and THRR while considering  patient's activity barriers and safety.   Exercise Prescription Goal: Initial exercise prescription builds to 30-45 minutes a day of aerobic activity, 2-3 days per week.  Home exercise guidelines will be given to patient during program as part of exercise prescription that the participant will acknowledge.  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 It is getting better and he wears a compression stocking on that arm. No limitations from MD noted.     Cardiac Risk Stratification  Moderate       6 Minute Walk: 6 Minute Walk    Row Name 08/16/17 1308         6 Minute Walk   Phase  Initial     Distance  1802 feet     Walk Time  6 minutes     # of Rest Breaks  0     MPH  3.41     METS  5.39     RPE  11     VO2 Peak  18.85     Symptoms  No     Resting HR  57 bpm     Resting BP  138/58     Resting Oxygen Saturation   99 %     Exercise Oxygen Saturation  during 6 min walk  99 %     Max Ex. HR  136 bpm     Max Ex. BP  146/64     2 Minute Post BP  132/60        Oxygen Initial Assessment:   Oxygen Re-Evaluation:   Oxygen Discharge (Final Oxygen Re-Evaluation):   Initial Exercise Prescription: Initial  Exercise Prescription - 08/16/17 1300      Date of Initial Exercise RX and Referring Provider   Date  08/16/17    Referring Provider  Kandis Cocking MD      Treadmill   MPH  3.4    Grade  3    Minutes  15    METs  5.01      NuStep   Level  4    SPM  80    Minutes  15    METs  3      REL-XR   Level  3    Speed  50    Minutes  15    METs  3      Prescription Details   Frequency (times per week)  3    Duration  Progress to 45 minutes of aerobic exercise without signs/symptoms of physical distress      Intensity   THRR 40-80% of Max Heartrate  101-145    Ratings of Perceived Exertion  11-13    Perceived Dyspnea  0-4      Progression   Progression  Continue to progress workloads to maintain intensity without signs/symptoms of physical distress.      Resistance Training   Training Prescription  Yes    Weight  4 lbs    Reps  10-15       Perform Capillary Blood Glucose checks as needed.  Exercise Prescription Changes: Exercise  Prescription Changes    Row Name 08/16/17 1200 09/01/17 1400 09/01/17 1700         Response to Exercise   Blood Pressure (Admit)  138/58  126/72  -     Blood Pressure (Exercise)  146/66  152/74  -     Blood Pressure (Exit)  132/60  122/80  -     Heart Rate (Admit)  57 bpm  118 bpm  -     Heart Rate (Exercise)  136 bpm  150 bpm  -     Heart Rate (Exit)  67 bpm  98 bpm  -     Oxygen Saturation (Admit)  99 %  -  -     Oxygen Saturation (Exercise)  99 %  -  -     Rating of Perceived Exertion (Exercise)  11  12  -     Symptoms  none  none  -     Comments  walk test results  -  -     Duration  -  Progress to 45 minutes of aerobic exercise without signs/symptoms of physical distress  -     Intensity  -  THRR unchanged  -       Progression   Progression  -  Continue to progress workloads to maintain intensity without signs/symptoms of physical distress.  -     Average METs  -  5  -       Resistance Training   Training Prescription  -  Yes  -     Weight  -  4   -     Reps  -  10-15  -       Treadmill   MPH  -  3.4  -     Grade  -  3  -     Minutes  -  15  -     METs  -  5.01  -       REL-XR   Level  -  4  -  Speed  -  50  -     Minutes  -  15  -       Home Exercise Plan   Plans to continue exercise at  -  -  Home (comment) walk     Frequency  -  -  Add 1 additional day to program exercise sessions.     Initial Home Exercises Provided  -  -  09/01/17        Exercise Comments: Exercise Comments    Row Name 08/19/17 1724 09/01/17 1749         Exercise Comments  First full day of exercise!  Patient was oriented to gym and equipment including functions, settings, policies, and procedures.  Patient's individual exercise prescription and treatment plan were reviewed.  All starting workloads were established based on the results of the 6 minute walk test done at initial orientation visit.  The plan for exercise progression was also introduced and progression will be customized  based on patient's performance and goals.  Reviewed home exercise with pt today.  Pt plans to walk for exercise.  Reviewed THR, pulse, RPE, sign and symptoms, NTG use, and when to call 911 or MD.  Also discussed weather considerations and indoor options.  Pt voiced understanding.         Exercise Goals and Review: Exercise Goals    Row Name 08/16/17 1351             Exercise Goals   Increase Physical Activity  Yes       Intervention  Provide advice, education, support and counseling about physical activity/exercise needs.;Develop an individualized exercise prescription for aerobic and resistive training based on initial evaluation findings, risk stratification, comorbidities and participant's personal goals.       Expected Outcomes  Achievement of increased cardiorespiratory fitness and enhanced flexibility, muscular endurance and strength shown through measurements of functional capacity and personal statement of participant.       Increase Strength and Stamina  Yes       Intervention  Provide advice, education, support and counseling about physical activity/exercise needs.;Develop an individualized exercise prescription for aerobic and resistive training based on initial evaluation findings, risk stratification, comorbidities and participant's personal goals.       Expected Outcomes  Achievement of increased cardiorespiratory fitness and enhanced flexibility, muscular endurance and strength shown through measurements of functional capacity and personal statement of participant.       Able to understand and use rate of perceived exertion (RPE) scale  Yes       Intervention  Provide education and explanation on how to use RPE scale       Expected Outcomes  Short Term: Able to use RPE daily in rehab to express subjective intensity level;Long Term:  Able to use RPE to guide intensity level when exercising independently       Knowledge and understanding of Target Heart Rate Range (THRR)  Yes        Intervention  Provide education and explanation of THRR including how the numbers were predicted and where they are located for reference       Expected Outcomes  Short Term: Able to state/look up THRR;Long Term: Able to use THRR to govern intensity when exercising independently;Short Term: Able to use daily as guideline for intensity in rehab       Able to check pulse independently  Yes       Intervention  Provide education and demonstration on how to check  pulse in carotid and radial arteries.;Review the importance of being able to check your own pulse for safety during independent exercise       Expected Outcomes  Short Term: Able to explain why pulse checking is important during independent exercise;Long Term: Able to check pulse independently and accurately       Understanding of Exercise Prescription  Yes       Intervention  Provide education, explanation, and written materials on patient's individual exercise prescription       Expected Outcomes  Short Term: Able to explain program exercise prescription;Long Term: Able to explain home exercise prescription to exercise independently          Exercise Goals Re-Evaluation : Exercise Goals Re-Evaluation    Row Name 08/19/17 1724 09/01/17 1452 09/01/17 1749         Exercise Goal Re-Evaluation   Exercise Goals Review  Understanding of Exercise Prescription;Knowledge and understanding of Target Heart Rate Range (THRR);Able to understand and use rate of perceived exertion (RPE) scale  Increase Physical Activity;Increase Strength and Stamina;Able to understand and use rate of perceived exertion (RPE) scale;Knowledge and understanding of Target Heart Rate Range (THRR)  Increase Physical Activity;Increase Strength and Stamina;Able to understand and use rate of perceived exertion (RPE) scale;Able to check pulse independently;Knowledge and understanding of Target Heart Rate Range (THRR);Understanding of Exercise Prescription     Comments  Reviewed  RPE scale, THR and program prescription with pt today.  Pt voiced understanding and was given a copy of goals to take home.   Merry Proud is in his second week of heart track.  He is tolerating exercise well.  Staff will monitor progress  Reviewed home exercise with pt today.  Pt plans to walk for exercise.  Reviewed THR, pulse, RPE, sign and symptoms, NTG use, and when to call 911 or MD.  Also discussed weather considerations and indoor options.  Pt voiced understanding.     Expected Outcomes  Short: Use RPE daily to regulate intensity.  Long: Follow program prescription in THR.  Short - Merry Proud will attend class 3 days per week Long - Merry Proud will exrecise independently  SHort - Merry Proud will add one day to program sessions Long - Pt will maintain exercise independently        Discharge Exercise Prescription (Final Exercise Prescription Changes): Exercise Prescription Changes - 09/01/17 1700      Home Exercise Plan   Plans to continue exercise at  Home (comment) walk    Frequency  Add 1 additional day to program exercise sessions.    Initial Home Exercises Provided  09/01/17       Nutrition:  Target Goals: Understanding of nutrition guidelines, daily intake of sodium <1572m, cholesterol <2040m calories 30% from fat and 7% or less from saturated fats, daily to have 5 or more servings of fruits and vegetables.  Biometrics: Pre Biometrics - 08/16/17 1351      Pre Biometrics   Height  5' 9.2" (1.758 m)    Weight  170 lb 14.4 oz (77.5 kg)    Waist Circumference  36 inches    Hip Circumference  39 inches    Waist to Hip Ratio  0.92 %    BMI (Calculated)  25.08    Single Leg Stand  30 seconds        Nutrition Therapy Plan and Nutrition Goals: Nutrition Therapy & Goals - 08/16/17 1241      Intervention Plan   Intervention  Prescribe, educate and counsel regarding individualized  specific dietary modifications aiming towards targeted core components such as weight, hypertension, lipid management,  diabetes, heart failure and other comorbidities.;Nutrition handout(s) given to patient.    Expected Outcomes  Short Term Goal: Understand basic principles of dietary content, such as calories, fat, sodium, cholesterol and nutrients.;Short Term Goal: A plan has been developed with personal nutrition goals set during dietitian appointment.;Long Term Goal: Adherence to prescribed nutrition plan.       Nutrition Assessments: Nutrition Assessments - 08/16/17 1241      MEDFICTS Scores   Pre Score  42       Nutrition Goals Re-Evaluation:   Nutrition Goals Discharge (Final Nutrition Goals Re-Evaluation):   Psychosocial: Target Goals: Acknowledge presence or absence of significant depression and/or stress, maximize coping skills, provide positive support system. Participant is able to verbalize types and ability to use techniques and skills needed for reducing stress and depression.   Initial Review & Psychosocial Screening: Initial Psych Review & Screening - 08/16/17 1242      Initial Review   Current issues with  Current Stress Concerns    Source of Stress Concerns  Unable to perform yard/household activities    Comments  Is eager to return to work and be able to do things around the house normally.       Family Dynamics   Good Support System?  Yes wife and friends and family      Barriers   Psychosocial barriers to participate in program  There are no identifiable barriers or psychosocial needs.;The patient should benefit from training in stress management and relaxation.      Screening Interventions   Interventions  Yes;Encouraged to exercise;Program counselor consult;Provide feedback about the scores to participant;To provide support and resources with identified psychosocial needs    Expected Outcomes  Short Term goal: Utilizing psychosocial counselor, staff and physician to assist with identification of specific Stressors or current issues interfering with healing process. Setting  desired goal for each stressor or current issue identified.;Long Term Goal: Stressors or current issues are controlled or eliminated.;Short Term goal: Identification and review with participant of any Quality of Life or Depression concerns found by scoring the questionnaire.;Long Term goal: The participant improves quality of Life and PHQ9 Scores as seen by post scores and/or verbalization of changes       Quality of Life Scores:  Quality of Life - 08/16/17 1058      Quality of Life Scores   Health/Function Pre  26.08 %    Socioeconomic Pre  23.25 %    Psych/Spiritual Pre  23.57 %    Family Pre  24.9 %    GLOBAL Pre  24.68 %      Scores of 19 and below usually indicate a poorer quality of life in these areas.  A difference of  2-3 points is a clinically meaningful difference.  A difference of 2-3 points in the total score of the Quality of Life Index has been associated with significant improvement in overall quality of life, self-image, physical symptoms, and general health in studies assessing change in quality of life.  PHQ-9: Recent Review Flowsheet Data    Depression screen [facility] 2/9 08/16/2017   Decreased Interest 0   Down, Depressed, Hopeless 0   PHQ - 2 Score 0   Altered sleeping 1   Tired, decreased energy 0   Change in appetite 0   Feeling bad or failure about yourself  1   Trouble concentrating 0   Moving slowly or fidgety/restless  0   Suicidal thoughts 0   PHQ-9 Score 2     Interpretation of Total Score  Total Score Depression Severity:  1-4 = Minimal depression, 5-9 = Mild depression, 10-14 = Moderate depression, 15-19 = Moderately severe depression, 20-27 = Severe depression   Psychosocial Evaluation and Intervention: Psychosocial Evaluation - 08/25/17 1659      Psychosocial Evaluation & Interventions   Interventions  Encouraged to exercise with the program and follow exercise prescription;Stress management education;Relaxation education    Comments  Counselor met  with Mr. Hoefling Merry Proud) today for initial psychosocial evaluation.  He is a 52 year old who had (4) stents inserted on 12/21 after discovering blockages.  He has a strong support system with a spouse of 83 years and (2) adult children who live locally.  He also has a brother and a sister close by.  Merry Proud reports sleeping well and has a good appetite.  He denies a history of depression or anxiety or any current symptoms.  He states he has minimal stress in his life and is typically in a positive mood most of the time.  Merry Proud has goals to be educated more on cardiac issues and to get in the habit of exercising consistently.  Staff will follow with him throughout the course of this program.      Expected Outcomes  Merry Proud will benefit from consistent exercise to achieve his stated goals.  The educational and psychoeducational components of this program will be helpful in learning and understanding his condition more and coping more positively with it.      Continue Psychosocial Services   Follow up required by staff       Psychosocial Re-Evaluation:   Psychosocial Discharge (Final Psychosocial Re-Evaluation):   Vocational Rehabilitation: Provide vocational rehab assistance to qualifying candidates.   Vocational Rehab Evaluation & Intervention: Vocational Rehab - 08/16/17 1251      Initial Vocational Rehab Evaluation & Intervention   Assessment shows need for Vocational Rehabilitation  No       Education: Education Goals: Education classes will be provided on a variety of topics geared toward better understanding of heart health and risk factor modification. Participant will state understanding/return demonstration of topics presented as noted by education test scores.  Learning Barriers/Preferences: Learning Barriers/Preferences - 08/16/17 1250      Learning Barriers/Preferences   Learning Barriers  Sight Needs reading glasses to read forms    Learning Preferences  Skilled Demonstration;Verbal  Instruction       Education Topics:  AED/CPR: - Group verbal and written instruction with the use of models to demonstrate the basic use of the AED with the basic ABC's of resuscitation.   General Nutrition Guidelines/Fats and Fiber: -Group instruction provided by verbal, written material, models and posters to present the general guidelines for heart healthy nutrition. Gives an explanation and review of dietary fats and fiber.   Cardiac Rehab from 09/06/2017 in [facility] Cardiac and Pulmonary Rehab  Date  09/06/17  Educator  PI  Instruction Review Code  1- Verbalizes Understanding      Controlling Sodium/Reading Food Labels: -Group verbal and written material supporting the discussion of sodium use in heart healthy nutrition. Review and explanation with models, verbal and written materials for utilization of the food label.   Exercise Physiology & General Exercise Guidelines: - Group verbal and written instruction with models to review the exercise physiology of the cardiovascular system and associated critical values. Provides general exercise guidelines with specific guidelines to those with  heart or lung disease.    Aerobic Exercise & Resistance Training: - Gives group verbal and written instruction on the various components of exercise. Focuses on aerobic and resistive training programs and the benefits of this training and how to safely progress through these programs..   Flexibility, Balance, Mind/Body Relaxation: Provides group verbal/written instruction on the benefits of flexibility and balance training, including mind/body exercise modes such as yoga, pilates and tai chi.  Demonstration and skill practice provided.   Stress and Anxiety: - Provides group verbal and written instruction about the health risks of elevated stress and causes of high stress.  Discuss the correlation between heart/lung disease and anxiety and treatment options. Review healthy ways to manage with  stress and anxiety.   Cardiac Rehab from 09/06/2017 in [facility] Cardiac and Pulmonary Rehab  Date  09/01/17  Educator  [facility]  Instruction Review Code  1- Verbalizes Understanding      Depression: - Provides group verbal and written instruction on the correlation between heart/lung disease and depressed mood, treatment options, and the stigmas associated with seeking treatment.   Anatomy & Physiology of the Heart: - Group verbal and written instruction and models provide basic cardiac anatomy and physiology, with the coronary electrical and arterial systems. Review of Valvular disease and Heart Failure   Cardiac Rehab from 09/06/2017 in [facility] Cardiac and Pulmonary Rehab  Date  08/30/17  Educator  CE  Instruction Review Code  1- Verbalizes Understanding      Cardiac Procedures: - Group verbal and written instruction to review commonly prescribed medications for heart disease. Reviews the medication, class of the drug, and side effects. Includes the steps to properly store meds and maintain the prescription regimen. (beta blockers and nitrates)   Cardiac Medications I: - Group verbal and written instruction to review commonly prescribed medications for heart disease. Reviews the medication, class of the drug, and side effects. Includes the steps to properly store meds and maintain the prescription regimen.   Cardiac Rehab from 09/06/2017 in [facility] Cardiac and Pulmonary Rehab  Date  08/23/17 [Part 2 08/25/17 CE]  Educator  [facility]  Instruction Review Code  1- Verbalizes Understanding      Cardiac Medications II: -Group verbal and written instruction to review commonly prescribed medications for heart disease. Reviews the medication, class of the drug, and side effects. (all other drug classes)    Go Sex-Intimacy & Heart Disease, Get SMART - Goal Setting: - Group verbal and written instruction through game format to discuss heart disease and the return to sexual intimacy. Provides group verbal and  written material to discuss and apply goal setting through the application of the S.M.A.R.T. Method.   Other Matters of the Heart: - Provides group verbal, written materials and models to describe Stable Angina and Peripheral Artery. Includes description of the disease process and treatment options available to the cardiac patient.   Exercise & Equipment Safety: - Individual verbal instruction and demonstration of equipment use and safety with use of the equipment.   Cardiac Rehab from 09/06/2017 in [facility] Cardiac and Pulmonary Rehab  Date  08/16/17  Educator  [place]  Instruction Review Code  1- Verbalizes Understanding      Infection Prevention: - Provides verbal and written material to individual with discussion of infection control including proper hand washing and proper equipment cleaning during exercise session.   Cardiac Rehab from 09/06/2017 in [facility] Cardiac and Pulmonary Rehab  Date  08/16/17  Educator  [place]  Instruction Review Code  1-  Verbalizes Understanding      Falls Prevention: - Provides verbal and written material to individual with discussion of falls prevention and safety.   Cardiac Rehab from 09/06/2017 in [facility] Cardiac and Pulmonary Rehab  Date  08/16/17  Educator  [place]  Instruction Review Code  1- Verbalizes Understanding      Diabetes: - Individual verbal and written instruction to review signs/symptoms of diabetes, desired ranges of glucose level fasting, after meals and with exercise. Acknowledge that pre and post exercise glucose checks will be done for 3 sessions at entry of program.   Know Your Numbers and Risk Factors: -Group verbal and written instruction about important numbers in your health.  Discussion of what are risk factors and how they play a role in the disease process.  Review of Cholesterol, Blood Pressure, Diabetes, and BMI and the role they play in your overall health.   Sleep Hygiene: -Provides group verbal and written instruction about how  sleep can affect your health.  Define sleep hygiene, discuss sleep cycles and impact of sleep habits. Review good sleep hygiene tips.    Other: -Provides group and verbal instruction on various topics (see comments)   Knowledge Questionnaire Score: Knowledge Questionnaire Score - 08/16/17 1250      Knowledge Questionnaire Score   Pre Score  22/28 Correct responses reviewed with patient. He verbalized understanding.       Core Components/Risk Factors/Patient Goals at Admission: Personal Goals and Risk Factors at Admission - 08/16/17 1228      Core Components/Risk Factors/Patient Goals on Admission    Weight Management  Weight Maintenance;Yes    Intervention  Weight Management: Provide education and appropriate resources to help participant work on and attain dietary goals.;Weight Management: Develop a combined nutrition and exercise program designed to reach desired caloric intake, while maintaining appropriate intake of nutrient and fiber, sodium and fats, and appropriate energy expenditure required for the weight goal.    Admit Weight  170 lb 14.4 oz (77.5 kg)    Goal Weight: Short Term  168 lb (76.2 kg)    Goal Weight: Long Term  165 lb (74.8 kg)    Expected Outcomes  Weight Maintenance: Understanding of the daily nutrition guidelines, which includes 25-35% calories from fat, 7% or less cal from saturated fats, less than 219m cholesterol, less than 1.5gm of sodium, & 5 or more servings of fruits and vegetables daily;Short Term: Continue to assess and modify interventions until short term weight is achieved    Improve shortness of breath with ADL's  Yes states he has always gotten SOB since younger if he did anything more exertional than walking. He would like to build up endurance to tolerate physical activity more.     Intervention  Provide education, individualized exercise plan and daily activity instruction to help decrease symptoms of SOB with activities of daily living.     Expected Outcomes  Short Term: Achieves a reduction of symptoms when performing activities of daily living.    Hypertension  Yes    Intervention  Provide education on lifestyle modifcations including regular physical activity/exercise, weight management, moderate sodium restriction and increased consumption of fresh fruit, vegetables, and low fat dairy, alcohol moderation, and smoking cessation.;Monitor prescription use compliance.    Expected Outcomes  Short Term: Continued assessment and intervention until BP is < 140/959mHG in hypertensive participants. < 130/8057mG in hypertensive participants with diabetes, heart failure or chronic kidney disease.;Long Term: Maintenance of blood pressure at goal levels.  Lipids  Yes    Intervention  Provide education and support for participant on nutrition & aerobic/resistive exercise along with prescribed medications to achieve LDL <68m, HDL >47m    Expected Outcomes  Short Term: Participant states understanding of desired cholesterol values and is compliant with medications prescribed. Participant is following exercise prescription and nutrition guidelines.;Long Term: Cholesterol controlled with medications as prescribed, with individualized exercise RX and with personalized nutrition plan. Value goals: LDL < 7036mHDL > 40 mg.    Personal Goal Other  Yes    Personal Goal  Develop a consistent exercise routine to continue outside of the program.     Intervention  Discuss home exercises with patient and encourage him to join a gym.    Expected Outcomes  Patient will continue with exercise at home or at a gym outside of days he comes to class and will continue once he completes the program.        Core Components/Risk Factors/Patient Goals Review:    Core Components/Risk Factors/Patient Goals at Discharge (Final Review):    ITP Comments: ITP Comments    Row Name 08/16/17 1225 09/08/17 0641         ITP Comments  Medical Review Completed; initial  ITP created. Diagnosis Documentation can be found in [facility] dated 08/12/2017.  30 Day review. Continue with ITP unless directed changes per Medical Director review.  New to program         Comments:

## 2017-09-08 NOTE — Progress Notes (Signed)
Daily Session Note  Patient Details  Name: Jerome Hart MRN: [id number] Date of Birth: 1965-10-19 Referring Provider:     Cardiac Rehab from 08/16/2017 in [facility] Cardiac and Pulmonary Rehab  Referring Provider  Kandis Cocking MD      Encounter Date: 09/08/2017  Check In: Session Check In - 09/08/17 1717      Check-In   Location  [facility]-Cardiac & Pulmonary Rehab    Staff Present  Renita Papa, RN BSN;Carroll Enterkin, RN, Vickki Hearing, BA, ACSM CEP, Exercise Physiologist    Supervising physician immediately available to respond to emergencies  See telemetry face sheet for immediately available ER MD    Medication changes reported      No    Fall or balance concerns reported     No    Warm-up and Cool-down  Performed on first and last piece of equipment    Resistance Training Performed  Yes    VAD Patient?  No      Pain Assessment   Currently in Pain?  No/denies    Multiple Pain Sites  No          Social History   Tobacco Use  Smoking Status Never Smoker  Smokeless Tobacco Never Used    Goals Met:  Independence with exercise equipment Exercise tolerated well No report of cardiac concerns or symptoms Strength training completed today  Goals Unmet:  Not Applicable  Comments: Pt able to follow exercise prescription today without complaint.  Will continue to monitor for progression.    Dr. Emily Filbert is Medical Director for [place] and [name].

## 2017-09-09 DIAGNOSIS — Z955 Presence of coronary angioplasty implant and graft: Secondary | ICD-10-CM

## 2017-09-09 NOTE — Progress Notes (Signed)
Daily Session Note  Patient Details  Name: Jerome Hart MRN: [id number] Date of Birth: Sep 10, 1965 Referring Provider:     Cardiac Rehab from 08/16/2017 in [facility] Cardiac and Pulmonary Rehab  Referring Provider  Kandis Cocking MD      Encounter Date: 09/09/2017  Check In: Session Check In - 09/09/17 1718      Check-In   Location  [facility]-Cardiac & Pulmonary Rehab    Staff Present  Earlean Shawl, BS, ACSM CEP, Exercise Physiologist;Meredith Sherryll Burger, RN BSN;Lenisha Lacap Flavia Shipper    Supervising physician immediately available to respond to emergencies  See telemetry face sheet for immediately available ER MD    Medication changes reported      No    Fall or balance concerns reported     No    Warm-up and Cool-down  Performed on first and last piece of equipment    Resistance Training Performed  Yes    VAD Patient?  No      Pain Assessment   Currently in Pain?  No/denies          Social History   Tobacco Use  Smoking Status Never Smoker  Smokeless Tobacco Never Used    Goals Met:  Independence with exercise equipment Exercise tolerated well No report of cardiac concerns or symptoms Strength training completed today  Goals Unmet:  Not Applicable  Comments: Pt able to follow exercise prescription today without complaint.  Will continue to monitor for progression.   Dr. Emily Filbert is Medical Director for [place] and [name].

## 2017-09-13 ENCOUNTER — Encounter: Payer: 59 | Attending: Family Medicine | Admitting: *Deleted

## 2017-09-13 DIAGNOSIS — Z7982 Long term (current) use of aspirin: Secondary | ICD-10-CM | POA: Insufficient documentation

## 2017-09-13 DIAGNOSIS — Z955 Presence of coronary angioplasty implant and graft: Secondary | ICD-10-CM | POA: Diagnosis not present

## 2017-09-13 DIAGNOSIS — Z79899 Other long term (current) drug therapy: Secondary | ICD-10-CM | POA: Insufficient documentation

## 2017-09-13 DIAGNOSIS — Z7902 Long term (current) use of antithrombotics/antiplatelets: Secondary | ICD-10-CM | POA: Diagnosis not present

## 2017-09-13 NOTE — Progress Notes (Signed)
Daily Session Note  Patient Details  Name: Jerome Hart MRN: [id number] Date of Birth: 1966-06-25 Referring Provider:     Cardiac Rehab from 08/16/2017 in [facility] Cardiac and Pulmonary Rehab  Referring Provider  Kandis Cocking MD      Encounter Date: 09/13/2017  Check In: Session Check In - 09/13/17 1636      Check-In   Location  [facility]-Cardiac & Pulmonary Rehab    Staff Present  Earlean Shawl, BS, ACSM CEP, Exercise Physiologist;Amanda Oletta Darter, BA, ACSM CEP, Exercise Physiologist;Carroll Enterkin, RN, BSN    Supervising physician immediately available to respond to emergencies  See telemetry face sheet for immediately available ER MD    Medication changes reported      No    Fall or balance concerns reported     No    Warm-up and Cool-down  Performed on first and last piece of equipment    Resistance Training Performed  Yes    VAD Patient?  No      Pain Assessment   Currently in Pain?  No/denies    Multiple Pain Sites  No          Social History   Tobacco Use  Smoking Status Never Smoker  Smokeless Tobacco Never Used    Goals Met:  Independence with exercise equipment Exercise tolerated well No report of cardiac concerns or symptoms Strength training completed today  Goals Unmet:  Not Applicable  Comments: Pt able to follow exercise prescription today without complaint.  Will continue to monitor for progression.    Dr. Emily Filbert is Medical Director for [place] and [name].

## 2017-09-15 DIAGNOSIS — Z955 Presence of coronary angioplasty implant and graft: Secondary | ICD-10-CM | POA: Diagnosis not present

## 2017-09-15 NOTE — Progress Notes (Signed)
Daily Session Note  Patient Details  Name: Jerome Hart MRN: [id number] Date of Birth: January 12, 1966 Referring Provider:     Cardiac Rehab from 08/16/2017 in [facility] Cardiac and Pulmonary Rehab  Referring Provider  Kandis Cocking MD      Encounter Date: 09/15/2017  Check In: Session Check In - 09/15/17 1638      Check-In   Location  [facility]Cardiac [facility]monary Re[facility]    Staff Present  Renita Papa, RN Vickki Hearing, BA, ACSM CEP, Exercise Physiologist;Carroll Enterkin, RN, BSN    Supervising physician immediately available to respond to emergencies  See telemetry face sheet for immediately available ER MD    Medication changes reported      No    Fall or balance concerns reported     No    Warm-up and Cool-down  Performed on first and last piece of equipment    Resistance Training Performed  Yes    VAD Patient?  No      Pain Assessment   Currently in Pain?  No/denies    Multiple Pain Sites  No        Exercise Prescription Changes - 09/15/17 1300      Response to Exercise   Blood Pressure (Exercise)  164/78    Blood Pressure (Exit)  108/64    Heart Rate (Admit)  59 bpm    Heart Rate (Exercise)  128 bpm    Heart Rate (Exit)  77 bpm    Rating of Perceived Exertion (Exercise)  12    Symptoms  none    Duration  Progress to 45 minutes of aerobic exercise without signs/symptoms of physical distress    Intensity  THRR unchanged      Progression   Progression  Continue to progress workloads to maintain intensity without signs/symptoms of physical distress.    Average METs  5.25      Resistance Training   Training Prescription  Yes    Weight  4 lb    Reps  10-15      NuStep   Level  4    SPM  80    Minutes  15      REL-XR   Level  4    Speed  50    Minutes  15    METs  5.6      Home Exercise Plan   Plans to continue exercise at  Home (comment) walk    Frequency  Add 1 additional day to program exercise sessions.    Initial Home Exercises Provided  09/01/17        Social History   Tobacco Use  Smoking Status Never Smoker  Smokeless Tobacco Never Used    Goals Met:  Independence with exercise equipment Exercise tolerated well No report of cardiac concerns or symptoms Strength training completed today  Goals Unmet:  Not Applicable  Comments: Pt able to follow exercise prescription today without complaint.  Will continue to monitor for progression.    Dr. Emily Filbert is Medical Director for [place] and [name].

## 2017-09-16 DIAGNOSIS — Z955 Presence of coronary angioplasty implant and graft: Secondary | ICD-10-CM

## 2017-09-16 NOTE — Progress Notes (Signed)
Daily Session Note  Patient Details  Name: Jerome Hart MRN: [id number] Date of Birth: 05-27-1966 Referring Provider:     Cardiac Rehab from 08/16/2017 in [facility] Cardiac and Pulmonary Rehab  Referring Provider  Kandis Cocking MD      Encounter Date: 09/16/2017  Check In: Session Check In - 09/16/17 1644      Check-In   Location  [facility]    Staff Present  Renita Papa, RN Moises Blood, BS, ACSM CEP, Exercise Physiologist;Joseph Flavia Shipper    Supervising physician immediately available to respond to emergencies  See telemetry face sheet for immediately available ER MD    Medication changes reported      No    Fall or balance concerns reported     No    Warm-up and Cool-down  Performed on first and last piece of equipment    Resistance Training Performed  Yes    VAD Patient?  No      Pain Assessment   Currently in Pain?  No/denies          Social History   Tobacco Use  Smoking Status Never Smoker  Smokeless Tobacco Never Used    Goals Met:  Independence with exercise equipment Exercise tolerated well No report of cardiac concerns or symptoms Strength training completed today  Goals Unmet:  Not Applicable  Comments:Pt able to follow exercise prescription today without complaint.  Will continue to monitor for progression.   Dr. Emily Filbert is Medical Director for [place] and [name].

## 2017-09-20 ENCOUNTER — Encounter: Payer: 59 | Admitting: *Deleted

## 2017-09-20 DIAGNOSIS — Z955 Presence of coronary angioplasty implant and graft: Secondary | ICD-10-CM

## 2017-09-20 NOTE — Progress Notes (Signed)
Daily Session Note  Patient Details  Name: Jerome Hart MRN: [id number] Date of Birth: [birth year] Referring Provider:     Cardiac Rehab from 08/16/2017 in [facility] Cardiac and Pulmonary Rehab  Referring Provider  Kandis Cocking MD      Encounter Date: 09/20/2017  Check In: Session Check In - 09/20/17 1732      Check-In   Location  [facility]iac [facility] Re[facility]    Staff Present  Nyoka Cowden, RN, BSN, [place], BS, ACSM CEP, Exercise Physiologist;Amanda Oletta Darter, [place], ACSM CEP, Exercise Physiologist;Joseph Flavia Shipper    Supervising physician immediately available to respond to emergencies  See telemetry face sheet for immediately available ER MD    Medication changes reported      No    Fall or balance concerns reported     No    Warm-up and Cool-down  Performed on first and last piece of equipment    Resistance Training Performed  Yes    VAD Patient?  No      Pain Assessment   Currently in Pain?  No/denies    Multiple Pain Sites  No          Social History   Tobacco Use  Smoking Status Never Smoker  Smokeless Tobacco Never Used    Goals Met:  Independence with exercise equipment Exercise tolerated well No report of cardiac concerns or symptoms Strength training completed today  Goals Unmet:  Not Applicable  Comments: Pt able to follow exercise prescription today without complaint.  Will continue to monitor for progression.    Dr. Emily Filbert is Medical Director for [place] and [name].

## 2017-09-22 ENCOUNTER — Encounter: Payer: 59 | Admitting: *Deleted

## 2017-09-22 DIAGNOSIS — Z955 Presence of coronary angioplasty implant and graft: Secondary | ICD-10-CM | POA: Diagnosis not present

## 2017-09-22 NOTE — Progress Notes (Signed)
Daily Session Note  Patient Details  Name: Jerome Hart MRN: [id number] Date of Birth: [birth year] Referring Provider:     Cardiac Rehab from 08/16/2017 in [facility] Cardiac and Pulmonary Rehab  Referring Provider  Kandis Cocking MD      Encounter Date: 09/22/2017  Check In: Session Check In - 09/22/17 1658      Check-In   Location  [facility]    Staff Present  Renita Papa, RN Vickki Hearing, BA, ACSM CEP, Exercise Physiologist;Carroll Enterkin, RN, BSN    Supervising physician immediately available to respond to emergencies  See telemetry face sheet for immediately available ER MD    Medication changes reported      No    Fall or balance concerns reported     No    Warm-up and Cool-down  Performed on first and last piece of equipment    Resistance Training Performed  Yes    VAD Patient?  No      Pain Assessment   Currently in Pain?  No/denies          Social History   Tobacco Use  Smoking Status Never Smoker  Smokeless Tobacco Never Used    Goals Met:  Independence with exercise equipment Exercise tolerated well No report of cardiac concerns or symptoms Strength training completed today  Goals Unmet:  Not Applicable  Comments: Pt able to follow exercise prescription today without complaint.  Will continue to monitor for progression.    Dr. Emily Filbert is Medical Director for [place] and [name].

## 2017-09-23 DIAGNOSIS — Z955 Presence of coronary angioplasty implant and graft: Secondary | ICD-10-CM | POA: Diagnosis not present

## 2017-09-23 NOTE — Progress Notes (Signed)
Daily Session Note  Patient Details  Name: Jerome Hart MRN: [id number] Date of Birth: October 28, 1965 Referring Provider:     Cardiac Rehab from 08/16/2017 in [facility] Cardiac and Pulmonary Rehab  Referring Provider  Jerome Cocking MD      Encounter Date: 09/23/2017  Check In: Session Check In - 09/23/17 1657      Check-In   Location  [facility] Re[facility]    Staff Present  Earlean Shawl, BS, ACSM CEP, Exercise Physiologist;Meredith Sherryll Burger, RN BSN;Yassin Scales Flavia Shipper    Supervising physician immediately available to respond to emergencies  See telemetry face sheet for immediately available ER MD    Medication changes reported      No    Fall or balance concerns reported     No    Warm-up and Cool-down  Performed on first and last piece of equipment    Resistance Training Performed  Yes    VAD Patient?  No      Pain Assessment   Currently in Pain?  No/denies          Social History   Tobacco Use  Smoking Status Never Smoker  Smokeless Tobacco Never Used    Goals Met:  Independence with exercise equipment Exercise tolerated well Personal goals reviewed No report of cardiac concerns or symptoms Strength training completed today  Goals Unmet:  Not Applicable  Comments: Pt able to follow exercise prescription today without complaint.  Will continue to monitor for progression.   Dr. Emily Hart is Medical Director for [place] and [name].

## 2017-09-27 DIAGNOSIS — Z955 Presence of coronary angioplasty implant and graft: Secondary | ICD-10-CM

## 2017-09-27 NOTE — Progress Notes (Signed)
Daily Session Note  Patient Details  Name: Jerome Hart MRN: [id number] Date of Birth: 08-03-1966 Referring Provider:     Cardiac Rehab from 08/16/2017 in [facility] Cardiac and Pulmonary Rehab  Referring Provider  Kandis Cocking MD      Encounter Date: 09/27/2017  Check In: Session Check In - 09/27/17 1630      Check-In   Location  [facility]monary Re[facility]    Staff Present  Earlean Shawl, BS, ACSM CEP, Exercise Physiologist;Amanda Oletta Darter, BA, ACSM CEP, Exercise Physiologist;Carroll Enterkin, RN, BSN    Supervising physician immediately available to respond to emergencies  See telemetry face sheet for immediately available ER MD    Medication changes reported      No    Fall or balance concerns reported     No    Warm-up and Cool-down  Performed on first and last piece of equipment    Resistance Training Performed  Yes    VAD Patient?  No      Pain Assessment   Currently in Pain?  No/denies    Multiple Pain Sites  No          Social History   Tobacco Use  Smoking Status Never Smoker  Smokeless Tobacco Never Used    Goals Met:  Independence with exercise equipment Exercise tolerated well No report of cardiac concerns or symptoms Strength training completed today  Goals Unmet:  Not Applicable  Comments: Pt able to follow exercise prescription today without complaint.  Will continue to monitor for progression.    Dr. Emily Filbert is Medical Director for [name] and [name].

## 2017-09-29 ENCOUNTER — Encounter: Payer: 59 | Admitting: *Deleted

## 2017-09-29 DIAGNOSIS — Z955 Presence of coronary angioplasty implant and graft: Secondary | ICD-10-CM

## 2017-09-29 NOTE — Progress Notes (Signed)
Daily Session Note  Patient Details  Name: DESHAN HEMMELGARN MRN: [id number] Date of Birth: August 10, 1966 Referring Provider:     Cardiac Rehab from 08/16/2017 in [facility] Cardiac and Pulmonary Rehab  Referring Provider  Kandis Cocking MD      Encounter Date: 09/29/2017  Check In: Session Check In - 09/29/17 1636      Check-In   Location  [facility] Re[facility]    Staff Present  Renita Papa, RN Vickki Hearing, BA, ACSM CEP, Exercise Physiologist;Carroll Enterkin, RN, BSN    Supervising physician immediately available to respond to emergencies  See telemetry face sheet for immediately available ER MD    Medication changes reported      No    Fall or balance concerns reported     No    Warm-up and Cool-down  Performed on first and last piece of equipment    Resistance Training Performed  Yes    VAD Patient?  No      Pain Assessment   Currently in Pain?  No/denies          Social History   Tobacco Use  Smoking Status Never Smoker  Smokeless Tobacco Never Used    Goals Met:  Independence with exercise equipment Exercise tolerated well No report of cardiac concerns or symptoms Strength training completed today  Goals Unmet:  Not Applicable  Comments: Pt able to follow exercise prescription today without complaint.  Will continue to monitor for progression.    Dr. Emily Filbert is Medical Director for [place] and [name].

## 2017-09-30 VITALS — Ht 69.2 in | Wt 165.4 lb

## 2017-09-30 DIAGNOSIS — Z955 Presence of coronary angioplasty implant and graft: Secondary | ICD-10-CM | POA: Diagnosis not present

## 2017-09-30 NOTE — Progress Notes (Signed)
Daily Session Note  Patient Details  Name: Jerome Hart MRN: [id number] Date of Birth: December 03, 1965 Referring Provider:     Cardiac Rehab from 08/16/2017 in [facility] Cardiac and Pulmonary Rehab  Referring Provider  Kandis Cocking MD      Encounter Date: 09/30/2017  Check In: Session Check In - 09/30/17 1711      Check-In   Location  [facility]iac [facility]monary Re[facility]    Staff Present  Renita Papa, RN Moises Blood, BS, ACSM CEP, Exercise Physiologist;Amanda Oletta Darter, BA, ACSM CEP, Exercise Physiologist;Susanne Bice, RN, BSN, CCRP    Supervising physician immediately available to respond to emergencies  See telemetry face sheet for immediately available ER MD    Medication changes reported      No    Fall or balance concerns reported     No    Warm-up and Cool-down  Performed on first and last piece of equipment    Resistance Training Performed  Yes    VAD Patient?  No      Pain Assessment   Currently in Pain?  No/denies    Multiple Pain Sites  No          Social History   Tobacco Use  Smoking Status Never Smoker  Smokeless Tobacco Never Used    Goals Met:  Independence with exercise equipment Exercise tolerated well Personal goals reviewed No report of cardiac concerns or symptoms Strength training completed today  Goals Unmet:  Not Applicable  Comments:  6 Minute Walk    Row Name 08/16/17 1308 09/30/17 1743       6 Minute Walk   Phase  Initial  Discharge    Distance  1802 feet  2040 feet    Distance % Change  -  13 %    Distance Feet Change  -  238 ft    Walk Time  6 minutes  6 minutes    # of Rest Breaks  0  0    MPH  3.41  3.86    METS  5.39  6.08    RPE  11  13    Perceived Dyspnea   -  1    VO2 Peak  18.85  21.3    Symptoms  No  No    Resting HR  57 bpm  75 bpm    Resting BP  138/58  122/58    Resting Oxygen Saturation   99 %  -    Exercise Oxygen Saturation  during 6 min walk  99 %  -    Max Ex. HR  136 bpm  150 bpm    Max Ex. BP  146/64  158/68     2 Minute Post BP  132/60  -     Pt able to follow exercise prescription today without complaint.  Will continue to monitor for progression.    Dr. Emily Filbert is Medical Director for [place] and [name].

## 2017-10-04 ENCOUNTER — Encounter: Payer: 59 | Admitting: *Deleted

## 2017-10-04 DIAGNOSIS — Z955 Presence of coronary angioplasty implant and graft: Secondary | ICD-10-CM | POA: Diagnosis not present

## 2017-10-04 NOTE — Progress Notes (Signed)
Daily Session Note  Patient Details  Name: Jerome Hart MRN: [id number] Date of Birth: October 30, 1965 Referring Provider:     Cardiac Rehab from 08/16/2017 in [facility] Cardiac and Pulmonary Rehab  Referring Provider  Kandis Cocking MD      Encounter Date: 10/04/2017  Check In: Session Check In - 10/04/17 1727      Check-In   Location  [facility]    Staff Present  Earlean Shawl, BS, ACSM CEP, Exercise Physiologist;Amanda Oletta Darter, BA, ACSM CEP, Exercise Physiologist;Meredith Sherryll Burger, RN BSN;Susanne Bice, RN, BSN, Florestine Avers, RN BSN    Supervising physician immediately available to respond to emergencies  See telemetry face sheet for immediately available ER MD    Medication changes reported      No    Fall or balance concerns reported     No    Warm-up and Cool-down  Performed on first and last piece of equipment    Resistance Training Performed  Yes    VAD Patient?  No      Pain Assessment   Currently in Pain?  No/denies    Multiple Pain Sites  No          Social History   Tobacco Use  Smoking Status Never Smoker  Smokeless Tobacco Never Used    Goals Met:  Independence with exercise equipment Exercise tolerated well No report of cardiac concerns or symptoms Strength training completed today  Goals Unmet:  Not Applicable  Comments: Pt able to follow exercise prescription today without complaint.  Will continue to monitor for progression.    Dr. Emily Filbert is Medical Director for [place] and [name].

## 2017-10-06 ENCOUNTER — Encounter: Payer: Self-pay | Admitting: *Deleted

## 2017-10-06 ENCOUNTER — Encounter: Payer: 59 | Admitting: *Deleted

## 2017-10-06 DIAGNOSIS — Z955 Presence of coronary angioplasty implant and graft: Secondary | ICD-10-CM | POA: Diagnosis not present

## 2017-10-06 NOTE — Progress Notes (Signed)
Daily Session Note  Patient Details  Name: Jerome Hart MRN: [id number] Date of Birth: 03/08/1966 Referring Provider:     Cardiac Rehab from 08/16/2017 in [facility]  Referring Provider  Deen, Cody MD      Encounter Date: 10/06/2017  Check In: Session Check In - 10/06/17 1719      Check-In   Location  [facility]    Staff Present  Meredith Craven, RN BSN;Amanda Sommer, BA, ACSM CEP, Exercise Physiologist;Carroll Enterkin, RN, BSN    Supervising physician immediately available to respond to emergencies  See telemetry face sheet for immediately available ER MD    Medication changes reported      No    Fall or balance concerns reported     No    Warm-up and Cool-down  Performed on first and last piece of equipment    Resistance Training Performed  Yes    VAD Patient?  No      Pain Assessment   Currently in Pain?  No/denies          Social History   Tobacco Use  Smoking Status Never Smoker  Smokeless Tobacco Never Used    Goals Met:  Independence with exercise equipment Exercise tolerated well No report of cardiac concerns or symptoms Strength training completed today  Goals Unmet:  Not Applicable  Comments: Pt able to follow exercise prescription today without complaint.  Will continue to monitor for progression.     Dr. Mark Miller is Medical Director for [facility] and [name]. 

## 2017-10-06 NOTE — Progress Notes (Signed)
Cardiac Individual Treatment Plan  Patient Details  Name: Jerome Hart MRN: [id number] Date of Birth: 11/23/1965 Referring Provider:     Cardiac Rehab from 08/16/2017 in [facility] Cardiac and Pulmonary Rehab  Referring Provider  Kandis Cocking MD      Initial Encounter Date:    Cardiac Rehab from 08/16/2017 in [facility] Cardiac and Pulmonary Rehab  Date  08/16/17  Referring Provider  Kandis Cocking MD      Visit Diagnosis: Status post coronary artery stent placement  Patient's Home Medications on Admission:  Current Outpatient Medications:  .  aspirin EC 81 MG tablet, Take 81 mg by mouth., Disp: , Rfl:  .  atorvastatin (LIPITOR) 80 MG tablet, Take 80 mg by mouth., Disp: , Rfl:  .  isosorbide mononitrate (IMDUR) 30 MG 24 hr tablet, Take 1 tablet (30 mg total) by mouth daily. (Patient not taking: Reported on 08/16/2017), Disp: 15 tablet, Rfl: 0 .  nitroGLYCERIN (NITROSTAT) 0.4 MG SL tablet, Place 1 tablet (0.4 mg total) under the tongue every 5 (five) minutes as needed for chest pain. To ED if pain persists after 3 doses, Disp: 15 tablet, Rfl: 0  Past Medical History: No past medical history on file.  Tobacco Use: Social History   Tobacco Use  Smoking Status Never Smoker  Smokeless Tobacco Never Used    Labs: Recent Review Flowsheet Data    There is no flowsheet data to display.       Exercise Target Goals:    Exercise Program Goal: Individual exercise prescription set using results from initial 6 min walk test and THRR while considering  patient's activity barriers and safety.   Exercise Prescription Goal: Initial exercise prescription builds to 30-45 minutes a day of aerobic activity, 2-3 days per week.  Home exercise guidelines will be given to patient during program as part of exercise prescription that the participant will acknowledge.  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 It is getting better and he wears a compression stocking on that arm. No limitations from MD noted.     Cardiac Risk Stratification  Moderate       6 Minute Walk: 6 Minute Walk    Row Name 08/16/17 1308 09/30/17 1743       6 Minute Walk   Phase  Initial  Discharge    Distance  1802 feet  2040 feet    Distance % Change  -  13 %    Distance Feet Change  -  238 ft    Walk Time  6 minutes  6 minutes    # of Rest Breaks  0  0    MPH  3.41  3.86    METS  5.39  6.08    RPE  11  13    Perceived Dyspnea   -  1    VO2 Peak  18.85  21.3    Symptoms  No  No    Resting HR  57 bpm  75 bpm    Resting BP  138/58  122/58    Resting Oxygen Saturation   99 %  -    Exercise Oxygen Saturation  during 6 min walk  99 %  -    Max Ex. HR  136 bpm  150 bpm    Max Ex. BP  146/64  158/68    2 Minute Post BP  132/60  -       Oxygen Initial Assessment:   Oxygen Re-Evaluation:   Oxygen Discharge (Final Oxygen Re-Evaluation):   Initial Exercise Prescription: Initial Exercise Prescription - 08/16/17 1300      Date of Initial Exercise RX and Referring Provider   Date  08/16/17    Referring Provider  Kandis Cocking MD      Treadmill   MPH  3.4    Grade  3    Minutes  15    METs  5.01      NuStep   Level  4    SPM  80    Minutes  15    METs  3      REL-XR   Level  3    Speed  50    Minutes  15    METs  3      Prescription Details   Frequency (times per week)  3    Duration  Progress to 45 minutes of aerobic exercise without signs/symptoms of physical distress      Intensity   THRR 40-80% of Max Heartrate  101-145    Ratings of Perceived Exertion  11-13    Perceived Dyspnea  0-4      Progression   Progression  Continue to progress workloads to maintain intensity without signs/symptoms of physical distress.       Resistance Training   Training Prescription  Yes    Weight  4 lbs    Reps  10-15       Perform Capillary Blood Glucose checks as needed.  Exercise Prescription Changes: Exercise Prescription Changes    Row Name 08/16/17 1200 09/01/17 1400 09/01/17 1700 09/15/17 1300 09/28/17 1500     Response to Exercise   Blood Pressure (Admit)  138/58  126/72  -  -  124/62   Blood Pressure (Exercise)  146/66  152/74  -  164/78  146/60   Blood Pressure (Exit)  132/60  122/80  -  108/64  122/62   Heart Rate (Admit)  57 bpm  118 bpm  -  59 bpm  89 bpm   Heart Rate (Exercise)  136 bpm  150 bpm  -  128 bpm  125 bpm   Heart Rate (Exit)  67 bpm  98 bpm  -  77 bpm  79 bpm   Oxygen Saturation (Admit)  99 %  -  -  -  -   Oxygen Saturation (Exercise)  99 %  -  -  -  -   Rating of Perceived Exertion (Exercise)  11  12  -  12  12   Symptoms  none  none  -  none  none   Comments  walk test results  -  -  -  -   Duration  -  Progress to 45 minutes of aerobic exercise without signs/symptoms of physical distress  -  Progress to 45 minutes of aerobic exercise without signs/symptoms of physical distress  Continue with 45 min of aerobic exercise without signs/symptoms of physical distress.   Intensity  -  THRR unchanged  -  THRR unchanged  THRR unchanged     Progression   Progression  -  Continue to progress workloads to maintain intensity without signs/symptoms of physical distress.  -  Continue to progress workloads to maintain intensity without  signs/symptoms of physical distress.  Continue to progress workloads to maintain intensity without signs/symptoms of physical distress.   Average METs  -  5  -  5.25  4.6     Resistance Training   Training Prescription  -  Yes  -  Yes  Yes   Weight  -  4   -  4 lb  4 lb   Reps  -  10-15  -  10-15  10-15     Treadmill   MPH  -  3.4  -  -  3.5   Grade  -  3  -  -  3.5   Minutes  -  15  -  -  15   METs  -  5.01  -  -  5.61     NuStep   Level  -  -  -  4  5    SPM  -  -  -  80  80   Minutes  -  -  -  15  15   METs  -  -  -  -  3.7     REL-XR   Level  -  4  -  4  -   Speed  -  50  -  50  -   Minutes  -  15  -  15  -   METs  -  -  -  5.6  -     Home Exercise Plan   Plans to continue exercise at  -  -  Home (comment) walk  Home (comment) walk  Home (comment) walk   Frequency  -  -  Add 1 additional day to program exercise sessions.  Add 1 additional day to program exercise sessions.  Add 1 additional day to program exercise sessions.   Initial Home Exercises Provided  -  -  09/01/17  09/01/17  09/01/17      Exercise Comments: Exercise Comments    Row Name 08/19/17 1724 09/01/17 1749         Exercise Comments  First full day of exercise!  Patient was oriented to gym and equipment including functions, settings, policies, and procedures.  Patient's individual exercise prescription and treatment plan were reviewed.  All starting workloads were established based on the results of the 6 minute walk test done at initial orientation visit.  The plan for exercise progression was also introduced and progression will be customized based on patient's performance and goals.  Reviewed home exercise with pt today.  Pt plans to walk for exercise.  Reviewed THR, pulse, RPE, sign and symptoms, NTG use, and when to call 911 or MD.  Also discussed weather considerations and indoor options.  Pt voiced understanding.         Exercise Goals and Review: Exercise Goals    Row Name 08/16/17 1351             Exercise Goals   Increase Physical Activity  Yes       Intervention  Provide advice, education, support and counseling about physical activity/exercise needs.;Develop an individualized exercise prescription for aerobic and resistive training based on initial evaluation findings, risk stratification, comorbidities and participant's personal goals.       Expected Outcomes  Achievement of increased cardiorespiratory fitness and enhanced flexibility, muscular  endurance and strength shown through measurements of functional capacity and personal statement of participant.       Increase Strength and Stamina  Yes  Intervention  Provide advice, education, support and counseling about physical activity/exercise needs.;Develop an individualized exercise prescription for aerobic and resistive training based on initial evaluation findings, risk stratification, comorbidities and participant's personal goals.       Expected Outcomes  Achievement of increased cardiorespiratory fitness and enhanced flexibility, muscular endurance and strength shown through measurements of functional capacity and personal statement of participant.       Able to understand and use rate of perceived exertion (RPE) scale  Yes       Intervention  Provide education and explanation on how to use RPE scale       Expected Outcomes  Short Term: Able to use RPE daily in rehab to express subjective intensity level;Long Term:  Able to use RPE to guide intensity level when exercising independently       Knowledge and understanding of Target Heart Rate Range (THRR)  Yes       Intervention  Provide education and explanation of THRR including how the numbers were predicted and where they are located for reference       Expected Outcomes  Short Term: Able to state/look up THRR;Long Term: Able to use THRR to govern intensity when exercising independently;Short Term: Able to use daily as guideline for intensity in rehab       Able to check pulse independently  Yes       Intervention  Provide education and demonstration on how to check pulse in carotid and radial arteries.;Review the importance of being able to check your own pulse for safety during independent exercise       Expected Outcomes  Short Term: Able to explain why pulse checking is important during independent exercise;Long Term: Able to check pulse independently and accurately       Understanding of Exercise Prescription  Yes        Intervention  Provide education, explanation, and written materials on patient's individual exercise prescription       Expected Outcomes  Short Term: Able to explain program exercise prescription;Long Term: Able to explain home exercise prescription to exercise independently          Exercise Goals Re-Evaluation : Exercise Goals Re-Evaluation    Row Name 08/19/17 1724 09/01/17 1452 09/01/17 1749 09/15/17 1401 09/28/17 1551     Exercise Goal Re-Evaluation   Exercise Goals Review  Understanding of Exercise Prescription;Knowledge and understanding of Target Heart Rate Range (THRR);Able to understand and use rate of perceived exertion (RPE) scale  Increase Physical Activity;Increase Strength and Stamina;Able to understand and use rate of perceived exertion (RPE) scale;Knowledge and understanding of Target Heart Rate Range (THRR)  Increase Physical Activity;Increase Strength and Stamina;Able to understand and use rate of perceived exertion (RPE) scale;Able to check pulse independently;Knowledge and understanding of Target Heart Rate Range (THRR);Understanding of Exercise Prescription  Increase Physical Activity;Able to understand and use rate of perceived exertion (RPE) scale;Increase Strength and Stamina  Increase Physical Activity;Able to understand and use rate of perceived exertion (RPE) scale;Knowledge and understanding of Target Heart Rate Range (THRR);Increase Strength and Stamina;Understanding of Exercise Prescription   Comments  Reviewed RPE scale, THR and program prescription with pt today.  Pt voiced understanding and was given a copy of goals to take home.   Merry Proud is in his second week of heart track.  He is tolerating exercise well.  Staff will monitor progress  Reviewed home exercise with pt today.  Pt plans to walk for exercise.  Reviewed THR, pulse, RPE, sign and symptoms, NTG  use, and when to call 911 or MD.  Also discussed weather considerations and indoor options.  Pt voiced understanding.   Merry Proud is progressing well and has improved overall MET level.  Staff will review interval training.  Merry Proud continues to progress with exercise and has increased his TM speed and grade.  Staff will continue to monitor   Expected Outcomes  Short: Use RPE daily to regulate intensity.  Long: Follow program prescription in THR.  Short - Merry Proud will attend class 3 days per week Long - Merry Proud will exrecise independently  SHort - Merry Proud will add one day to program sessions Long - Pt will maintain exercise independently  Short - Merry Proud will add interval training to his program  Long - Merry Proud will continue to improve MET level  Short - Merry Proud will complete HT program  Long - Merry Proud will miantain fitness on his own.       Discharge Exercise Prescription (Final Exercise Prescription Changes): Exercise Prescription Changes - 09/28/17 1500      Response to Exercise   Blood Pressure (Admit)  124/62    Blood Pressure (Exercise)  146/60    Blood Pressure (Exit)  122/62    Heart Rate (Admit)  89 bpm    Heart Rate (Exercise)  125 bpm    Heart Rate (Exit)  79 bpm    Rating of Perceived Exertion (Exercise)  12    Symptoms  none    Duration  Continue with 45 min of aerobic exercise without signs/symptoms of physical distress.    Intensity  THRR unchanged      Progression   Progression  Continue to progress workloads to maintain intensity without signs/symptoms of physical distress.    Average METs  4.6      Resistance Training   Training Prescription  Yes    Weight  4 lb    Reps  10-15      Treadmill   MPH  3.5    Grade  3.5    Minutes  15    METs  5.61      NuStep   Level  5    SPM  80    Minutes  15    METs  3.7      Home Exercise Plan   Plans to continue exercise at  Home (comment) walk    Frequency  Add 1 additional day to program exercise sessions.    Initial Home Exercises Provided  09/01/17       Nutrition:  Target Goals: Understanding of nutrition guidelines, daily intake of sodium '1500mg'$ ,  cholesterol '200mg'$ , calories 30% from fat and 7% or less from saturated fats, daily to have 5 or more servings of fruits and vegetables.  Biometrics: Pre Biometrics - 08/16/17 1351      Pre Biometrics   Height  5' 9.2" (1.758 m)    Weight  170 lb 14.4 oz (77.5 kg)    Waist Circumference  36 inches    Hip Circumference  39 inches    Waist to Hip Ratio  0.92 %    BMI (Calculated)  25.08    Single Leg Stand  30 seconds      Post Biometrics - 09/30/17 1744       Post  Biometrics   Height  5' 9.2" (1.758 m)    Weight  165 lb 6.4 oz (75 kg)    Waist Circumference  36 inches    Hip Circumference  39 inches    Waist  to Hip Ratio  0.92 %    BMI (Calculated)  24.28    Single Leg Stand  30 seconds       Nutrition Therapy Plan and Nutrition Goals: Nutrition Therapy & Goals - 08/16/17 1241      Intervention Plan   Intervention  Prescribe, educate and counsel regarding individualized specific dietary modifications aiming towards targeted core components such as weight, hypertension, lipid management, diabetes, heart failure and other comorbidities.;Nutrition handout(s) given to patient.    Expected Outcomes  Short Term Goal: Understand basic principles of dietary content, such as calories, fat, sodium, cholesterol and nutrients.;Short Term Goal: A plan has been developed with personal nutrition goals set during dietitian appointment.;Long Term Goal: Adherence to prescribed nutrition plan.       Nutrition Assessments: Nutrition Assessments - 08/16/17 1241      MEDFICTS Scores   Pre Score  42       Nutrition Goals Re-Evaluation: Nutrition Goals Re-Evaluation    Row Name 09/23/17 1658             Goals   Current Weight  165 lb (74.8 kg)       Nutrition Goal  eat healthier, meet with the dietician       Comment  Merry Proud wants to meet with the dietician and learn to eat healthier.       Expected Outcome  Short: meet with the dietician. Long: adhere to a diet plan           Nutrition Goals Discharge (Final Nutrition Goals Re-Evaluation): Nutrition Goals Re-Evaluation - 09/23/17 1658      Goals   Current Weight  165 lb (74.8 kg)    Nutrition Goal  eat healthier, meet with the dietician    Comment  Merry Proud wants to meet with the dietician and learn to eat healthier.    Expected Outcome  Short: meet with the dietician. Long: adhere to a diet plan       Psychosocial: Target Goals: Acknowledge presence or absence of significant depression and/or stress, maximize coping skills, provide positive support system. Participant is able to verbalize types and ability to use techniques and skills needed for reducing stress and depression.   Initial Review & Psychosocial Screening: Initial Psych Review & Screening - 09/23/17 1609      Initial Review   Current issues with  Current Stress Concerns    Source of Stress Concerns  Unable to perform yard/household activities    Comments  Is eager to return to work and be able to do things around the house normally.       Family Dynamics   Good Support System?  Yes wife and friends and family      Barriers   Psychosocial barriers to participate in program  There are no identifiable barriers or psychosocial needs.;The patient should benefit from training in stress management and relaxation.      Screening Interventions   Expected Outcomes  Short Term goal: Utilizing psychosocial counselor, staff and physician to assist with identification of specific Stressors or current issues interfering with healing process. Setting desired goal for each stressor or current issue identified.;Long Term Goal: Stressors or current issues are controlled or eliminated.;Short Term goal: Identification and review with participant of any Quality of Life or Depression concerns found by scoring the questionnaire.;Long Term goal: The participant improves quality of Life and PHQ9 Scores as seen by post scores and/or verbalization of changes        Quality of Life  Scores:  Quality of Life - 08/16/17 1058      Quality of Life Scores   Health/Function Pre  26.08 %    Socioeconomic Pre  23.25 %    Psych/Spiritual Pre  23.57 %    Family Pre  24.9 %    GLOBAL Pre  24.68 %      Scores of 19 and below usually indicate a poorer quality of life in these areas.  A difference of  2-3 points is a clinically meaningful difference.  A difference of 2-3 points in the total score of the Quality of Life Index has been associated with significant improvement in overall quality of life, self-image, physical symptoms, and general health in studies assessing change in quality of life.  PHQ-9: Recent Review Flowsheet Data    Depression screen [facility] 2/9 08/16/2017   Decreased Interest 0   Down, Depressed, Hopeless 0   PHQ - 2 Score 0   Altered sleeping 1   Tired, decreased energy 0   Change in appetite 0   Feeling bad or failure about yourself  1   Trouble concentrating 0   Moving slowly or fidgety/restless 0   Suicidal thoughts 0   PHQ-9 Score 2     Interpretation of Total Score  Total Score Depression Severity:  1-4 = Minimal depression, 5-9 = Mild depression, 10-14 = Moderate depression, 15-19 = Moderately severe depression, 20-27 = Severe depression   Psychosocial Evaluation and Intervention: Psychosocial Evaluation - 08/25/17 1659      Psychosocial Evaluation & Interventions   Interventions  Encouraged to exercise with the program and follow exercise prescription;Stress management education;Relaxation education    Comments  Counselor met with Mr. Cates Merry Proud) today for initial psychosocial evaluation.  He is a 52 year old who had (4) stents inserted on 12/21 after discovering blockages.  He has a strong support system with a spouse of 24 years and (2) adult children who live locally.  He also has a brother and a sister close by.  Merry Proud reports sleeping well and has a good appetite.  He denies a history of depression or anxiety or any  current symptoms.  He states he has minimal stress in his life and is typically in a positive mood most of the time.  Merry Proud has goals to be educated more on cardiac issues and to get in the habit of exercising consistently.  Staff will follow with him throughout the course of this program.      Expected Outcomes  Merry Proud will benefit from consistent exercise to achieve his stated goals.  The educational and psychoeducational components of this program will be helpful in learning and understanding his condition more and coping more positively with it.      Continue Psychosocial Services   Follow up required by staff       Psychosocial Re-Evaluation: Psychosocial Re-Evaluation    Stanly Name 09/23/17 1612             Psychosocial Re-Evaluation   Current issues with  Current Stress Concerns       Comments  Merry Proud states he is able to do more of the work around the yard and home with no problems.       Expected Outcomes  Short - Continue to exercise  Long - maintain fitness on his own       Interventions  Encouraged to attend Cardiac Rehabilitation for the exercise       Continue Psychosocial Services   Follow up  required by staff          Psychosocial Discharge (Final Psychosocial Re-Evaluation): Psychosocial Re-Evaluation - 09/23/17 1612      Psychosocial Re-Evaluation   Current issues with  Current Stress Concerns    Comments  Merry Proud states he is able to do more of the work around the yard and home with no problems.    Expected Outcomes  Short - Continue to exercise  Long - maintain fitness on his own    Interventions  Encouraged to attend Cardiac Rehabilitation for the exercise    Continue Psychosocial Services   Follow up required by staff       Vocational Rehabilitation: Provide vocational rehab assistance to qualifying candidates.   Vocational Rehab Evaluation & Intervention: Vocational Rehab - 08/16/17 1251      Initial Vocational Rehab Evaluation & Intervention   Assessment shows  need for Vocational Rehabilitation  No       Education: Education Goals: Education classes will be provided on a variety of topics geared toward better understanding of heart health and risk factor modification. Participant will state understanding/return demonstration of topics presented as noted by education test scores.  Learning Barriers/Preferences: Learning Barriers/Preferences - 08/16/17 1250      Learning Barriers/Preferences   Learning Barriers  Sight Needs reading glasses to read forms    Learning Preferences  Skilled Demonstration;Verbal Instruction       Education Topics:  AED/CPR: - Group verbal and written instruction with the use of models to demonstrate the basic use of the AED with the basic ABC's of resuscitation.   Cardiac Rehab from 10/04/2017 in [facility] Cardiac and Pulmonary Rehab  Date  09/20/17  Educator  MA  Instruction Review Code  1- Verbalizes Understanding      General Nutrition Guidelines/Fats and Fiber: -Group instruction provided by verbal, written material, models and posters to present the general guidelines for heart healthy nutrition. Gives an explanation and review of dietary fats and fiber.   Cardiac Rehab from 10/04/2017 in [facility] Cardiac and Pulmonary Rehab  Date  09/06/17  Educator  PI  Instruction Review Code  1- Verbalizes Understanding      Controlling Sodium/Reading Food Labels: -Group verbal and written material supporting the discussion of sodium use in heart healthy nutrition. Review and explanation with models, verbal and written materials for utilization of the food label.   Cardiac Rehab from 10/04/2017 in [facility] Cardiac and Pulmonary Rehab  Date  09/13/17  Educator  PI  Instruction Review Code  1- Verbalizes Understanding      Exercise Physiology & General Exercise Guidelines: - Group verbal and written instruction with models to review the exercise physiology of the cardiovascular system and associated critical values. Provides  general exercise guidelines with specific guidelines to those with heart or lung disease.    Cardiac Rehab from 10/04/2017 in [facility] Cardiac and Pulmonary Rehab  Date  09/27/17  Educator  [facility]  Instruction Review Code  1- Verbalizes Understanding      Aerobic Exercise & Resistance Training: - Gives group verbal and written instruction on the various components of exercise. Focuses on aerobic and resistive training programs and the benefits of this training and how to safely progress through these programs..   Cardiac Rehab from 10/04/2017 in [facility] Cardiac and Pulmonary Rehab  Date  10/04/17  Educator  AS  Instruction Review Code  1- Verbalizes Understanding      Flexibility, Balance, Mind/Body Relaxation: Provides group verbal/written instruction on the benefits of flexibility and  balance training, including mind/body exercise modes such as yoga, pilates and tai chi.  Demonstration and skill practice provided.   Stress and Anxiety: - Provides group verbal and written instruction about the health risks of elevated stress and causes of high stress.  Discuss the correlation between heart/lung disease and anxiety and treatment options. Review healthy ways to manage with stress and anxiety.   Cardiac Rehab from 10/04/2017 in [facility] Cardiac and Pulmonary Rehab  Date  09/01/17  Educator  [facility]  Instruction Review Code  1- Verbalizes Understanding      Depression: - Provides group verbal and written instruction on the correlation between heart/lung disease and depressed mood, treatment options, and the stigmas associated with seeking treatment.   Cardiac Rehab from 10/04/2017 in [facility] Cardiac and Pulmonary Rehab  Date  09/29/17  Educator  [facility]  Instruction Review Code  1- Verbalizes Understanding      Anatomy & Physiology of the Heart: - Group verbal and written instruction and models provide basic cardiac anatomy and physiology, with the coronary electrical and arterial systems. Review of Valvular  disease and Heart Failure   Cardiac Rehab from 10/04/2017 in [facility] Cardiac and Pulmonary Rehab  Date  08/30/17  Educator  [place]  Instruction Review Code  1- Verbalizes Understanding      Cardiac Procedures: - Group verbal and written instruction to review commonly prescribed medications for heart disease. Reviews the medication, class of the drug, and side effects. Includes the steps to properly store meds and maintain the prescription regimen. (beta blockers and nitrates)   Cardiac Rehab from 10/04/2017 in [facility] Cardiac and Pulmonary Rehab  Date  09/08/17  Educator  [facility]  Instruction Review Code  1- Verbalizes Understanding      Cardiac Medications I: - Group verbal and written instruction to review commonly prescribed medications for heart disease. Reviews the medication, class of the drug, and side effects. Includes the steps to properly store meds and maintain the prescription regimen.   Cardiac Rehab from 10/04/2017 in [facility] Cardiac and Pulmonary Rehab  Date  08/23/17 [Part 2 08/25/17 CE]  Educator  [facility]  Instruction Review Code  1- Verbalizes Understanding      Cardiac Medications II: -Group verbal and written instruction to review commonly prescribed medications for heart disease. Reviews the medication, class of the drug, and side effects. (all other drug classes)    Go Sex-Intimacy & Heart Disease, Get SMART - Goal Setting: - Group verbal and written instruction through game format to discuss heart disease and the return to sexual intimacy. Provides group verbal and written material to discuss and apply goal setting through the application of the S.M.A.R.T. Method.   Cardiac Rehab from 10/04/2017 in [facility] Cardiac and Pulmonary Rehab  Date  09/08/17  Educator  [facility]  Instruction Review Code  1- Verbalizes Understanding      Other Matters of the Heart: - Provides group verbal, written materials and models to describe Stable Angina and Peripheral Artery. Includes description of the  disease process and treatment options available to the cardiac patient.   Exercise & Equipment Safety: - Individual verbal instruction and demonstration of equipment use and safety with use of the equipment.   Cardiac Rehab from 10/04/2017 in [facility] Cardiac and Pulmonary Rehab  Date  08/16/17  Educator  [place]  Instruction Review Code  1- Verbalizes Understanding      Infection Prevention: - Provides verbal and written material to individual with discussion of infection control including proper hand washing and proper  equipment cleaning during exercise session.   Cardiac Rehab from 10/04/2017 in [facility] Cardiac and Pulmonary Rehab  Date  08/16/17  Educator  [place]  Instruction Review Code  1- Verbalizes Understanding      Falls Prevention: - Provides verbal and written material to individual with discussion of falls prevention and safety.   Cardiac Rehab from 10/04/2017 in [facility] Cardiac and Pulmonary Rehab  Date  08/16/17  Educator  [place]  Instruction Review Code  1- Verbalizes Understanding      Diabetes: - Individual verbal and written instruction to review signs/symptoms of diabetes, desired ranges of glucose level fasting, after meals and with exercise. Acknowledge that pre and post exercise glucose checks will be done for 3 sessions at entry of program.   Know Your Numbers and Risk Factors: -Group verbal and written instruction about important numbers in your health.  Discussion of what are risk factors and how they play a role in the disease process.  Review of Cholesterol, Blood Pressure, Diabetes, and BMI and the role they play in your overall health.   Sleep Hygiene: -Provides group verbal and written instruction about how sleep can affect your health.  Define sleep hygiene, discuss sleep cycles and impact of sleep habits. Review good sleep hygiene tips.    Cardiac Rehab from 10/04/2017 in [facility] Cardiac and Pulmonary Rehab  Date  09/15/17  Educator  [facility]  Instruction Review Code  1-  Verbalizes Understanding      Other: -Provides group and verbal instruction on various topics (see comments)   Knowledge Questionnaire Score: Knowledge Questionnaire Score - 08/16/17 1250      Knowledge Questionnaire Score   Pre Score  22/28 Correct responses reviewed with patient. He verbalized understanding.       Core Components/Risk Factors/Patient Goals at Admission: Personal Goals and Risk Factors at Admission - 08/16/17 1228      Core Components/Risk Factors/Patient Goals on Admission    Weight Management  Weight Maintenance;Yes    Intervention  Weight Management: Provide education and appropriate resources to help participant work on and attain dietary goals.;Weight Management: Develop a combined nutrition and exercise program designed to reach desired caloric intake, while maintaining appropriate intake of nutrient and fiber, sodium and fats, and appropriate energy expenditure required for the weight goal.    Admit Weight  170 lb 14.4 oz (77.5 kg)    Goal Weight: Short Term  168 lb (76.2 kg)    Goal Weight: Long Term  165 lb (74.8 kg)    Expected Outcomes  Weight Maintenance: Understanding of the daily nutrition guidelines, which includes 25-35% calories from fat, 7% or less cal from saturated fats, less than '200mg'$  cholesterol, less than 1.5gm of sodium, & 5 or more servings of fruits and vegetables daily;Short Term: Continue to assess and modify interventions until short term weight is achieved    Improve shortness of breath with ADL's  Yes states he has always gotten SOB since younger if he did anything more exertional than walking. He would like to build up endurance to tolerate physical activity more.     Intervention  Provide education, individualized exercise plan and daily activity instruction to help decrease symptoms of SOB with activities of daily living.    Expected Outcomes  Short Term: Achieves a reduction of symptoms when performing activities of daily living.     Hypertension  Yes    Intervention  Provide education on lifestyle modifcations including regular physical activity/exercise, weight management, moderate sodium restriction and increased consumption  of fresh fruit, vegetables, and low fat dairy, alcohol moderation, and smoking cessation.;Monitor prescription use compliance.    Expected Outcomes  Short Term: Continued assessment and intervention until BP is < 140/40m HG in hypertensive participants. < 130/853mHG in hypertensive participants with diabetes, heart failure or chronic kidney disease.;Long Term: Maintenance of blood pressure at goal levels.    Lipids  Yes    Intervention  Provide education and support for participant on nutrition & aerobic/resistive exercise along with prescribed medications to achieve LDL '70mg'$ , HDL >'40mg'$ .    Expected Outcomes  Short Term: Participant states understanding of desired cholesterol values and is compliant with medications prescribed. Participant is following exercise prescription and nutrition guidelines.;Long Term: Cholesterol controlled with medications as prescribed, with individualized exercise RX and with personalized nutrition plan. Value goals: LDL < '70mg'$ , HDL > 40 mg.    Personal Goal Other  Yes    Personal Goal  Develop a consistent exercise routine to continue outside of the program.     Intervention  Discuss home exercises with patient and encourage him to join a gym.    Expected Outcomes  Patient will continue with exercise at home or at a gym outside of days he comes to class and will continue once he completes the program.        Core Components/Risk Factors/Patient Goals Review:  Goals and Risk Factor Review    Row Name 09/23/17 1609             Core Components/Risk Factors/Patient Goals Review   Personal Goals Review  Lipids;Hypertension       Review  Still taking all meds for BP and cholesterol.  He has been drinking more water.       Expected Outcomes  Short - sees cardiologist late  March/April Long - continue healthy lifestyle habits          Core Components/Risk Factors/Patient Goals at Discharge (Final Review):  Goals and Risk Factor Review - 09/23/17 1609      Core Components/Risk Factors/Patient Goals Review   Personal Goals Review  Lipids;Hypertension    Review  Still taking all meds for BP and cholesterol.  He has been drinking more water.    Expected Outcomes  Short - sees cardiologist late March/April Long - continue healthy lifestyle habits       ITP Comments: ITP Comments    Row Name 08/16/17 1225 09/08/17 0641 10/06/17 0607       ITP Comments  Medical Review Completed; initial ITP created. Diagnosis Documentation can be found in [facility] dated 08/12/2017.  30 Day review. Continue with ITP unless directed changes per Medical Director review.  New to program  30 day review. Continue with ITP unless directed changes per Medical Director review.        Comments:

## 2017-10-07 ENCOUNTER — Encounter: Payer: Self-pay | Admitting: Dietician

## 2017-10-07 DIAGNOSIS — Z955 Presence of coronary angioplasty implant and graft: Secondary | ICD-10-CM

## 2017-10-07 NOTE — Progress Notes (Signed)
Daily Session Note  Patient Details  Name: Jerome Hart MRN: [id number] Date of Birth: 04/30/1966 Referring Provider:     Cardiac Rehab from 08/16/2017 in [facility] Cardiac and Pulmonary Rehab  Referring Provider  Kandis Cocking MD      Encounter Date: 10/07/2017  Check In: Session Check In - 10/07/17 1707      Check-In   Location  [facility]-Cardiac & Pulmonary Rehab    Staff Present  Earlean Shawl, BS, ACSM CEP, Exercise Physiologist;Meredith Sherryll Burger, RN BSN;Nonie Lochner Flavia Shipper    Supervising physician immediately available to respond to emergencies  See telemetry face sheet for immediately available ER MD    Medication changes reported      No    Fall or balance concerns reported     No    Warm-up and Cool-down  Performed on first and last piece of equipment    Resistance Training Performed  Yes    VAD Patient?  No      Pain Assessment   Currently in Pain?  No/denies          Social History   Tobacco Use  Smoking Status Never Smoker  Smokeless Tobacco Never Used    Goals Met:  Independence with exercise equipment Exercise tolerated well No report of cardiac concerns or symptoms Strength training completed today  Goals Unmet:  Not Applicable  Comments: Pt able to follow exercise prescription today without complaint.  Will continue to monitor for progression.   Dr. Emily Filbert is Medical Director for [place] and [name].

## 2017-10-11 ENCOUNTER — Encounter: Payer: 59 | Attending: Family Medicine

## 2017-10-11 DIAGNOSIS — Z7982 Long term (current) use of aspirin: Secondary | ICD-10-CM | POA: Insufficient documentation

## 2017-10-11 DIAGNOSIS — Z955 Presence of coronary angioplasty implant and graft: Secondary | ICD-10-CM | POA: Diagnosis not present

## 2017-10-11 DIAGNOSIS — Z79899 Other long term (current) drug therapy: Secondary | ICD-10-CM | POA: Diagnosis not present

## 2017-10-11 DIAGNOSIS — Z7902 Long term (current) use of antithrombotics/antiplatelets: Secondary | ICD-10-CM | POA: Diagnosis not present

## 2017-10-11 NOTE — Patient Instructions (Signed)
Discharge Patient Instructions  Patient Details  Name: Jerome Hart MRN: [id number] Date of Birth: [birth year] Referring Provider:  Marjean Donna, MD   Number of Visits: 48  Reason for Discharge:  Patient reached a stable level of exercise. Patient independent in their exercise. Patient has met program and personal goals.  Smoking History:  Social History   Tobacco Use  Smoking Status Never Smoker  Smokeless Tobacco Never Used    Diagnosis:  No diagnosis found.  Initial Exercise Prescription: Initial Exercise Prescription - 08/16/17 1300      Date of Initial Exercise RX and Referring Provider   Date  08/16/17    Referring Provider  Kandis Cocking MD      Treadmill   MPH  3.4    Grade  3    Minutes  15    METs  5.01      NuStep   Level  4    SPM  80    Minutes  15    METs  3      REL-XR   Level  3    Speed  50    Minutes  15    METs  3      Prescription Details   Frequency (times per week)  3    Duration  Progress to 45 minutes of aerobic exercise without signs/symptoms of physical distress      Intensity   THRR 40-80% of Max Heartrate  101-145    Ratings of Perceived Exertion  11-13    Perceived Dyspnea  0-4      Progression   Progression  Continue to progress workloads to maintain intensity without signs/symptoms of physical distress.      Resistance Training   Training Prescription  Yes    Weight  4 lbs    Reps  10-15       Discharge Exercise Prescription (Final Exercise Prescription Changes): Exercise Prescription Changes - 09/28/17 1500      Response to Exercise   Blood Pressure (Admit)  124/62    Blood Pressure (Exercise)  146/60    Blood Pressure (Exit)  122/62    Heart Rate (Admit)  89 bpm    Heart Rate (Exercise)  125 bpm    Heart Rate (Exit)  79 bpm    Rating of Perceived Exertion (Exercise)  12    Symptoms  none    Duration  Continue with 45 min of aerobic exercise without signs/symptoms of physical distress.    Intensity  THRR  unchanged      Progression   Progression  Continue to progress workloads to maintain intensity without signs/symptoms of physical distress.    Average METs  4.6      Resistance Training   Training Prescription  Yes    Weight  4 lb    Reps  10-15      Treadmill   MPH  3.5    Grade  3.5    Minutes  15    METs  5.61      NuStep   Level  5    SPM  80    Minutes  15    METs  3.7      Home Exercise Plan   Plans to continue exercise at  Home (comment) walk    Frequency  Add 1 additional day to program exercise sessions.    Initial Home Exercises Provided  09/01/17       Functional Capacity: 6 Minute Walk  Staley Name 08/16/17 1308 09/30/17 1743       6 Minute Walk   Phase  Initial  Discharge    Distance  1802 feet  2040 feet    Distance % Change  -  13 %    Distance Feet Change  -  238 ft    Walk Time  6 minutes  6 minutes    # of Rest Breaks  0  0    MPH  3.41  3.86    METS  5.39  6.08    RPE  11  13    Perceived Dyspnea   -  1    VO2 Peak  18.85  21.3    Symptoms  No  No    Resting HR  57 bpm  75 bpm    Resting BP  138/58  122/58    Resting Oxygen Saturation   99 %  -    Exercise Oxygen Saturation  during 6 min walk  99 %  -    Max Ex. HR  136 bpm  150 bpm    Max Ex. BP  146/64  158/68    2 Minute Post BP  132/60  -       Quality of Life: Quality of Life - 10/07/17 1727      Quality of Life Scores   Health/Function Pre  26.08 %    Health/Function Post  23.5 %    Health/Function % Change  -9.89 %    Socioeconomic Pre  23.25 %    Socioeconomic Post  23.36 %    Socioeconomic % Change   0.47 %    Psych/Spiritual Pre  23.57 %    Psych/Spiritual Post  23.57 %    Psych/Spiritual % Change  0 %    Family Pre  24.9 %    Family Post  22.8 %    Family % Change  -8.43 %    GLOBAL Pre  24.68 %    GLOBAL Post  23.38 %    GLOBAL % Change  -5.27 %       Personal Goals: Goals established at orientation with interventions provided to work toward goal. Personal  Goals and Risk Factors at Admission - 08/16/17 1228      Core Components/Risk Factors/Patient Goals on Admission    Weight Management  Weight Maintenance;Yes    Intervention  Weight Management: Provide education and appropriate resources to help participant work on and attain dietary goals.;Weight Management: Develop a combined nutrition and exercise program designed to reach desired caloric intake, while maintaining appropriate intake of nutrient and fiber, sodium and fats, and appropriate energy expenditure required for the weight goal.    Admit Weight  170 lb 14.4 oz (77.5 kg)    Goal Weight: Short Term  168 lb (76.2 kg)    Goal Weight: Long Term  165 lb (74.8 kg)    Expected Outcomes  Weight Maintenance: Understanding of the daily nutrition guidelines, which includes 25-35% calories from fat, 7% or less cal from saturated fats, less than 267m cholesterol, less than 1.5gm of sodium, & 5 or more servings of fruits and vegetables daily;Short Term: Continue to assess and modify interventions until short term weight is achieved    Improve shortness of breath with ADL's  Yes states he has always gotten SOB since younger if he did anything more exertional than walking. He would like to build up endurance to tolerate physical activity more.     Intervention  Provide education, individualized exercise plan and daily activity instruction to help decrease symptoms of SOB with activities of daily living.    Expected Outcomes  Short Term: Achieves a reduction of symptoms when performing activities of daily living.    Hypertension  Yes    Intervention  Provide education on lifestyle modifcations including regular physical activity/exercise, weight management, moderate sodium restriction and increased consumption of fresh fruit, vegetables, and low fat dairy, alcohol moderation, and smoking cessation.;Monitor prescription use compliance.    Expected Outcomes  Short Term: Continued assessment and intervention  until BP is < 140/50m HG in hypertensive participants. < 130/842mHG in hypertensive participants with diabetes, heart failure or chronic kidney disease.;Long Term: Maintenance of blood pressure at goal levels.    Lipids  Yes    Intervention  Provide education and support for participant on nutrition & aerobic/resistive exercise along with prescribed medications to achieve LDL <703mHDL >48m42m  Expected Outcomes  Short Term: Participant states understanding of desired cholesterol values and is compliant with medications prescribed. Participant is following exercise prescription and nutrition guidelines.;Long Term: Cholesterol controlled with medications as prescribed, with individualized exercise RX and with personalized nutrition plan. Value goals: LDL < 70mg29mL > 40 mg.    Personal Goal Other  Yes    Personal Goal  Develop a consistent exercise routine to continue outside of the program.     Intervention  Discuss home exercises with patient and encourage him to join a gym.    Expected Outcomes  Patient will continue with exercise at home or at a gym outside of days he comes to class and will continue once he completes the program.         Personal Goals Discharge: Goals and Risk Factor Review - 09/23/17 1609      Core Components/Risk Factors/Patient Goals Review   Personal Goals Review  Lipids;Hypertension    Review  Still taking all meds for BP and cholesterol.  He has been drinking more water.    Expected Outcomes  Short - sees cardiologist late March/April Long - continue healthy lifestyle habits       Exercise Goals and Review: Exercise Goals    Row Name 08/16/17 1351             Exercise Goals   Increase Physical Activity  Yes       Intervention  Provide advice, education, support and counseling about physical activity/exercise needs.;Develop an individualized exercise prescription for aerobic and resistive training based on initial evaluation findings, risk  stratification, comorbidities and participant's personal goals.       Expected Outcomes  Achievement of increased cardiorespiratory fitness and enhanced flexibility, muscular endurance and strength shown through measurements of functional capacity and personal statement of participant.       Increase Strength and Stamina  Yes       Intervention  Provide advice, education, support and counseling about physical activity/exercise needs.;Develop an individualized exercise prescription for aerobic and resistive training based on initial evaluation findings, risk stratification, comorbidities and participant's personal goals.       Expected Outcomes  Achievement of increased cardiorespiratory fitness and enhanced flexibility, muscular endurance and strength shown through measurements of functional capacity and personal statement of participant.       Able to understand and use rate of perceived exertion (RPE) scale  Yes       Intervention  Provide education and explanation on how to use RPE scale  Expected Outcomes  Short Term: Able to use RPE daily in rehab to express subjective intensity level;Long Term:  Able to use RPE to guide intensity level when exercising independently       Knowledge and understanding of Target Heart Rate Range (THRR)  Yes       Intervention  Provide education and explanation of THRR including how the numbers were predicted and where they are located for reference       Expected Outcomes  Short Term: Able to state/look up THRR;Long Term: Able to use THRR to govern intensity when exercising independently;Short Term: Able to use daily as guideline for intensity in rehab       Able to check pulse independently  Yes       Intervention  Provide education and demonstration on how to check pulse in carotid and radial arteries.;Review the importance of being able to check your own pulse for safety during independent exercise       Expected Outcomes  Short Term: Able to explain why pulse  checking is important during independent exercise;Long Term: Able to check pulse independently and accurately       Understanding of Exercise Prescription  Yes       Intervention  Provide education, explanation, and written materials on patient's individual exercise prescription       Expected Outcomes  Short Term: Able to explain program exercise prescription;Long Term: Able to explain home exercise prescription to exercise independently          Nutrition & Weight - Outcomes: Pre Biometrics - 08/16/17 1351      Pre Biometrics   Height  5' 9.2" (1.758 m)    Weight  170 lb 14.4 oz (77.5 kg)    Waist Circumference  36 inches    Hip Circumference  39 inches    Waist to Hip Ratio  0.92 %    BMI (Calculated)  25.08    Single Leg Stand  30 seconds      Post Biometrics - 09/30/17 1744       Post  Biometrics   Height  5' 9.2" (1.758 m)    Weight  165 lb 6.4 oz (75 kg)    Waist Circumference  36 inches    Hip Circumference  39 inches    Waist to Hip Ratio  0.92 %    BMI (Calculated)  24.28    Single Leg Stand  30 seconds       Nutrition: Nutrition Therapy & Goals - 10/07/17 1743      Nutrition Therapy   Diet  TLC    Drug/Food Interactions  Statins/Certain Fruits    Protein (specify units)  9oz    Fiber  30 grams    Whole Grain Foods  3 servings    Saturated Fats  15 max. grams    Fruits and Vegetables  5 servings/day    Sodium  2000 grams      Personal Nutrition Goals   Personal Goal #2  Include healthy snacks such as fruits, nuts, popcorn (lowfat/ salt), homemade trail mix, Triscuit crackers, sunchips in place of regular chips.    Additional Goals?  No       Nutrition Discharge: Nutrition Assessments - 10/07/17 1721      MEDFICTS Scores   Pre Score  48       Education Questionnaire Score: Knowledge Questionnaire Score - 10/07/17 1722      Knowledge Questionnaire Score   Pre Score  22/28  Post Score  26/28 reviewed with patient       Goals reviewed with  patient; copy given to patient.

## 2017-10-11 NOTE — Progress Notes (Signed)
Daily Session Note  Patient Details  Name: Jerome Hart MRN: [id number] Date of Birth: 26-Dec-1965 Referring Provider:     Cardiac Rehab from 08/16/2017 in [facility]  Referring Provider  Kandis Cocking MD      Encounter Date: 10/11/2017  Check In: Session Check In - 10/11/17 1732      Check-In   Location  [facility]-Cardiac & Pulmonary Rehab    Staff Present  Renita Papa, RN Moises Blood, BS, ACSM CEP, Exercise Physiologist;Amanda Oletta Darter, BA, ACSM CEP, Exercise Physiologist;Carroll Enterkin, RN, BSN    Supervising physician immediately available to respond to emergencies  See telemetry face sheet for immediately available ER MD    Medication changes reported      No    Fall or balance concerns reported     No    Warm-up and Cool-down  Performed on first and last piece of equipment    Resistance Training Performed  Yes    VAD Patient?  No      Pain Assessment   Currently in Pain?  No/denies    Multiple Pain Sites  No          Social History   Tobacco Use  Smoking Status Never Smoker  Smokeless Tobacco Never Used    Goals Met:  Independence with exercise equipment Exercise tolerated well No report of cardiac concerns or symptoms Strength training completed today  Goals Unmet:  Not Applicable  Comments:  Julio graduated today from  rehab with 36 sessions completed.  Details of the patient's exercise prescription and what He needs to do in order to continue the prescription and progress were discussed with patient.  Patient was given a copy of prescription and goals.  Patient verbalized understanding.  Darin plans to continue to exercise by joining [employer].    Dr. Emily Filbert is Medical Director for [place] and [name].

## 2017-10-12 NOTE — Progress Notes (Addendum)
Cardiac Individual Treatment Plan  Patient Details  Name: ADITHYA DIFRANCESCO MRN: [id number] Date of Birth: 11/23/1965 Referring Provider:     Cardiac Rehab from 08/16/2017 in [facility] Cardiac and Pulmonary Rehab  Referring Provider  Kandis Cocking MD      Initial Encounter Date:    Cardiac Rehab from 08/16/2017 in [facility] Cardiac and Pulmonary Rehab  Date  08/16/17  Referring Provider  Kandis Cocking MD      Visit Diagnosis: Status post coronary artery stent placement  Patient's Home Medications on Admission:  Current Outpatient Medications:  .  aspirin EC 81 MG tablet, Take 81 mg by mouth., Disp: , Rfl:  .  atorvastatin (LIPITOR) 80 MG tablet, Take 80 mg by mouth., Disp: , Rfl:  .  isosorbide mononitrate (IMDUR) 30 MG 24 hr tablet, Take 1 tablet (30 mg total) by mouth daily. (Patient not taking: Reported on 08/16/2017), Disp: 15 tablet, Rfl: 0 .  nitroGLYCERIN (NITROSTAT) 0.4 MG SL tablet, Place 1 tablet (0.4 mg total) under the tongue every 5 (five) minutes as needed for chest pain. To ED if pain persists after 3 doses, Disp: 15 tablet, Rfl: 0  Past Medical History: No past medical history on file.  Tobacco Use: Social History   Tobacco Use  Smoking Status Never Smoker  Smokeless Tobacco Never Used    Labs: Recent Review Flowsheet Data    There is no flowsheet data to display.       Exercise Target Goals:    Exercise Program Goal: Individual exercise prescription set using results from initial 6 min walk test and THRR while considering  patient's activity barriers and safety.   Exercise Prescription Goal: Initial exercise prescription builds to 30-45 minutes a day of aerobic activity, 2-3 days per week.  Home exercise guidelines will be given to patient during program as part of exercise prescription that the participant will acknowledge.  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 It is getting better and he wears a compression stocking on that arm. No limitations from MD noted.     Cardiac Risk Stratification  Moderate       6 Minute Walk: 6 Minute Walk    Row Name 08/16/17 1308 09/30/17 1743       6 Minute Walk   Phase  Initial  Discharge    Distance  1802 feet  2040 feet    Distance % Change  -  13 %    Distance Feet Change  -  238 ft    Walk Time  6 minutes  6 minutes    # of Rest Breaks  0  0    MPH  3.41  3.86    METS  5.39  6.08    RPE  11  13    Perceived Dyspnea   -  1    VO2 Peak  18.85  21.3    Symptoms  No  No    Resting HR  57 bpm  75 bpm    Resting BP  138/58  122/58    Resting Oxygen Saturation   99 %  -    Exercise Oxygen Saturation  during 6 min walk  99 %  -    Max Ex. HR  136 bpm  150 bpm    Max Ex. BP  146/64  158/68    2 Minute Post BP  132/60  -       Oxygen Initial Assessment:   Oxygen Re-Evaluation:   Oxygen Discharge (Final Oxygen Re-Evaluation):   Initial Exercise Prescription: Initial Exercise Prescription - 08/16/17 1300      Date of Initial Exercise RX and Referring Provider   Date  08/16/17    Referring Provider  Kandis Cocking MD      Treadmill   MPH  3.4    Grade  3    Minutes  15    METs  5.01      NuStep   Level  4    SPM  80    Minutes  15    METs  3      REL-XR   Level  3    Speed  50    Minutes  15    METs  3      Prescription Details   Frequency (times per week)  3    Duration  Progress to 45 minutes of aerobic exercise without signs/symptoms of physical distress      Intensity   THRR 40-80% of Max Heartrate  101-145    Ratings of Perceived Exertion  11-13    Perceived Dyspnea  0-4      Progression   Progression  Continue to progress workloads to maintain intensity without signs/symptoms of physical distress.       Resistance Training   Training Prescription  Yes    Weight  4 lbs    Reps  10-15       Perform Capillary Blood Glucose checks as needed.  Exercise Prescription Changes: Exercise Prescription Changes    Row Name 08/16/17 1200 09/01/17 1400 09/01/17 1700 09/15/17 1300 09/28/17 1500     Response to Exercise   Blood Pressure (Admit)  138/58  126/72  -  -  124/62   Blood Pressure (Exercise)  146/66  152/74  -  164/78  146/60   Blood Pressure (Exit)  132/60  122/80  -  108/64  122/62   Heart Rate (Admit)  57 bpm  118 bpm  -  59 bpm  89 bpm   Heart Rate (Exercise)  136 bpm  150 bpm  -  128 bpm  125 bpm   Heart Rate (Exit)  67 bpm  98 bpm  -  77 bpm  79 bpm   Oxygen Saturation (Admit)  99 %  -  -  -  -   Oxygen Saturation (Exercise)  99 %  -  -  -  -   Rating of Perceived Exertion (Exercise)  11  12  -  12  12   Symptoms  none  none  -  none  none   Comments  walk test results  -  -  -  -   Duration  -  Progress to 45 minutes of aerobic exercise without signs/symptoms of physical distress  -  Progress to 45 minutes of aerobic exercise without signs/symptoms of physical distress  Continue with 45 min of aerobic exercise without signs/symptoms of physical distress.   Intensity  -  THRR unchanged  -  THRR unchanged  THRR unchanged     Progression   Progression  -  Continue to progress workloads to maintain intensity without signs/symptoms of physical distress.  -  Continue to progress workloads to maintain intensity without  signs/symptoms of physical distress.  Continue to progress workloads to maintain intensity without signs/symptoms of physical distress.   Average METs  -  5  -  5.25  4.6     Resistance Training   Training Prescription  -  Yes  -  Yes  Yes   Weight  -  4   -  4 lb  4 lb   Reps  -  10-15  -  10-15  10-15     Treadmill   MPH  -  3.4  -  -  3.5   Grade  -  3  -  -  3.5   Minutes  -  15  -  -  15   METs  -  5.01  -  -  5.61     NuStep   Level  -  -  -  4  5    SPM  -  -  -  80  80   Minutes  -  -  -  15  15   METs  -  -  -  -  3.7     REL-XR   Level  -  4  -  4  -   Speed  -  50  -  50  -   Minutes  -  15  -  15  -   METs  -  -  -  5.6  -     Home Exercise Plan   Plans to continue exercise at  -  -  Home (comment) walk  Home (comment) walk  Home (comment) walk   Frequency  -  -  Add 1 additional day to program exercise sessions.  Add 1 additional day to program exercise sessions.  Add 1 additional day to program exercise sessions.   Initial Home Exercises Provided  -  -  09/01/17  09/01/17  09/01/17      Exercise Comments: Exercise Comments    Row Name 08/19/17 1724 09/01/17 1749 10/06/17 1721       Exercise Comments  First full day of exercise!  Patient was oriented to gym and equipment including functions, settings, policies, and procedures.  Patient's individual exercise prescription and treatment plan were reviewed.  All starting workloads were established based on the results of the 6 minute walk test done at initial orientation visit.  The plan for exercise progression was also introduced and progression will be customized based on patient's performance and goals.  Reviewed home exercise with pt today.  Pt plans to walk for exercise.  Reviewed THR, pulse, RPE, sign and symptoms, NTG use, and when to call 911 or MD.  Also discussed weather considerations and indoor options.  Pt voiced understanding.  -        Exercise Goals and Review: Exercise Goals    Row Name 08/16/17 1351             Exercise Goals   Increase Physical Activity  Yes       Intervention  Provide advice, education, support and counseling about physical activity/exercise needs.;Develop an individualized exercise prescription for aerobic and resistive training based on initial evaluation findings, risk stratification, comorbidities and participant's personal goals.       Expected Outcomes  Achievement of increased cardiorespiratory fitness and enhanced flexibility,  muscular endurance and strength shown through measurements of functional capacity and personal statement of participant.       Increase Strength and Stamina  Yes       Intervention  Provide advice, education, support and counseling about physical activity/exercise needs.;Develop an individualized exercise prescription for aerobic and resistive training based on initial evaluation findings, risk stratification, comorbidities and participant's personal goals.       Expected Outcomes  Achievement of increased cardiorespiratory fitness and enhanced flexibility, muscular endurance and strength shown through measurements of functional capacity and personal statement of participant.       Able to understand and use rate of perceived exertion (RPE) scale  Yes       Intervention  Provide education and explanation on how to use RPE scale       Expected Outcomes  Short Term: Able to use RPE daily in rehab to express subjective intensity level;Long Term:  Able to use RPE to guide intensity level when exercising independently       Knowledge and understanding of Target Heart Rate Range (THRR)  Yes       Intervention  Provide education and explanation of THRR including how the numbers were predicted and where they are located for reference       Expected Outcomes  Short Term: Able to state/look up THRR;Long Term: Able to use THRR to govern intensity when exercising independently;Short Term: Able to use daily as guideline for intensity in rehab       Able to check pulse independently  Yes       Intervention  Provide education and demonstration on how to check pulse in carotid and radial arteries.;Review the importance of being able to check your own pulse for safety during independent exercise       Expected Outcomes  Short Term: Able to explain why pulse checking is important during independent exercise;Long Term: Able to check pulse independently and accurately       Understanding of Exercise Prescription  Yes        Intervention  Provide education, explanation, and written materials on patient's individual exercise prescription       Expected Outcomes  Short Term: Able to explain program exercise prescription;Long Term: Able to explain home exercise prescription to exercise independently          Exercise Goals Re-Evaluation : Exercise Goals Re-Evaluation    Row Name 08/19/17 1724 09/01/17 1452 09/01/17 1749 09/15/17 1401 09/28/17 1551     Exercise Goal Re-Evaluation   Exercise Goals Review  Understanding of Exercise Prescription;Knowledge and understanding of Target Heart Rate Range (THRR);Able to understand and use rate of perceived exertion (RPE) scale  Increase Physical Activity;Increase Strength and Stamina;Able to understand and use rate of perceived exertion (RPE) scale;Knowledge and understanding of Target Heart Rate Range (THRR)  Increase Physical Activity;Increase Strength and Stamina;Able to understand and use rate of perceived exertion (RPE) scale;Able to check pulse independently;Knowledge and understanding of Target Heart Rate Range (THRR);Understanding of Exercise Prescription  Increase Physical Activity;Able to understand and use rate of perceived exertion (RPE) scale;Increase Strength and Stamina  Increase Physical Activity;Able to understand and use rate of perceived exertion (RPE) scale;Knowledge and understanding of Target Heart Rate Range (THRR);Increase Strength and Stamina;Understanding of Exercise Prescription   Comments  Reviewed RPE scale, THR and program prescription with pt today.  Pt voiced understanding and was given a copy of goals to take home.   Merry Proud is in his second week of heart track.  He is tolerating exercise well.  Staff will monitor progress  Reviewed home exercise with pt today.  Pt plans to walk for exercise.  Reviewed  THR, pulse, RPE, sign and symptoms, NTG use, and when to call 911 or MD.  Also discussed weather considerations and indoor options.  Pt voiced  understanding.  Merry Proud is progressing well and has improved overall MET level.  Staff will review interval training.  Merry Proud continues to progress with exercise and has increased his TM speed and grade.  Staff will continue to monitor   Expected Outcomes  Short: Use RPE daily to regulate intensity.  Long: Follow program prescription in THR.  Short - Merry Proud will attend class 3 days per week Long - Merry Proud will exrecise independently  SHort - Merry Proud will add one day to program sessions Long - Pt will maintain exercise independently  Short - Merry Proud will add interval training to his program  Long - Merry Proud will continue to improve MET level  Short - Merry Proud will complete HT program  Long - Merry Proud will miantain fitness on his own.       Discharge Exercise Prescription (Final Exercise Prescription Changes): Exercise Prescription Changes - 09/28/17 1500      Response to Exercise   Blood Pressure (Admit)  124/62    Blood Pressure (Exercise)  146/60    Blood Pressure (Exit)  122/62    Heart Rate (Admit)  89 bpm    Heart Rate (Exercise)  125 bpm    Heart Rate (Exit)  79 bpm    Rating of Perceived Exertion (Exercise)  12    Symptoms  none    Duration  Continue with 45 min of aerobic exercise without signs/symptoms of physical distress.    Intensity  THRR unchanged      Progression   Progression  Continue to progress workloads to maintain intensity without signs/symptoms of physical distress.    Average METs  4.6      Resistance Training   Training Prescription  Yes    Weight  4 lb    Reps  10-15      Treadmill   MPH  3.5    Grade  3.5    Minutes  15    METs  5.61      NuStep   Level  5    SPM  80    Minutes  15    METs  3.7      Home Exercise Plan   Plans to continue exercise at  Home (comment) walk    Frequency  Add 1 additional day to program exercise sessions.    Initial Home Exercises Provided  09/01/17       Nutrition:  Target Goals: Understanding of nutrition guidelines, daily intake of sodium  <1516m, cholesterol <2053m calories 30% from fat and 7% or less from saturated fats, daily to have 5 or more servings of fruits and vegetables.  Biometrics: Pre Biometrics - 08/16/17 1351      Pre Biometrics   Height  5' 9.2" (1.758 m)    Weight  170 lb 14.4 oz (77.5 kg)    Waist Circumference  36 inches    Hip Circumference  39 inches    Waist to Hip Ratio  0.92 %    BMI (Calculated)  25.08    Single Leg Stand  30 seconds      Post Biometrics - 09/30/17 1744       Post  Biometrics   Height  5' 9.2" (1.758 m)    Weight  165 lb 6.4 oz (75 kg)    Waist Circumference  36 inches    Hip Circumference  39 inches    Waist to Hip Ratio  0.92 %    BMI (Calculated)  24.28    Single Leg Stand  30 seconds       Nutrition Therapy Plan and Nutrition Goals: Nutrition Therapy & Goals - 10/07/17 1743      Nutrition Therapy   Diet  TLC    Drug/Food Interactions  Statins/Certain Fruits    Protein (specify units)  9oz    Fiber  30 grams    Whole Grain Foods  3 servings    Saturated Fats  15 max. grams    Fruits and Vegetables  5 servings/day    Sodium  2000 grams      Personal Nutrition Goals   Personal Goal #2  Include healthy snacks such as fruits, nuts, popcorn (lowfat/ salt), homemade trail mix, Triscuit crackers, sunchips in place of regular chips.    Additional Goals?  No       Nutrition Assessments: Nutrition Assessments - 10/07/17 1721      MEDFICTS Scores   Pre Score  48       Nutrition Goals Re-Evaluation: Nutrition Goals Re-Evaluation    [place] Name 09/23/17 1658             Goals   Current Weight  165 lb (74.8 kg)       Nutrition Goal  eat healthier, meet with the dietician       Comment  Merry Proud wants to meet with the dietician and learn to eat healthier.       Expected Outcome  Short: meet with the dietician. Long: adhere to a diet plan          Nutrition Goals Discharge (Final Nutrition Goals Re-Evaluation): Nutrition Goals Re-Evaluation - 09/23/17 1658       Goals   Current Weight  165 lb (74.8 kg)    Nutrition Goal  eat healthier, meet with the dietician    Comment  Merry Proud wants to meet with the dietician and learn to eat healthier.    Expected Outcome  Short: meet with the dietician. Long: adhere to a diet plan       Psychosocial: Target Goals: Acknowledge presence or absence of significant depression and/or stress, maximize coping skills, provide positive support system. Participant is able to verbalize types and ability to use techniques and skills needed for reducing stress and depression.   Initial Review & Psychosocial Screening: Initial Psych Review & Screening - 09/23/17 1609      Initial Review   Current issues with  Current Stress Concerns    Source of Stress Concerns  Unable to perform yard/household activities    Comments  Is eager to return to work and be able to do things around the house normally.       Family Dynamics   Good Support System?  Yes wife and friends and family      Barriers   Psychosocial barriers to participate in program  There are no identifiable barriers or psychosocial needs.;The patient should benefit from training in stress management and relaxation.      Screening Interventions   Expected Outcomes  Short Term goal: Utilizing psychosocial counselor, staff and physician to assist with identification of specific Stressors or current issues interfering with healing process. Setting desired goal for each stressor or current issue identified.;Long Term Goal: Stressors or current issues are controlled or eliminated.;Short Term goal: Identification and review with participant of any Quality of Life or Depression concerns found by scoring the  questionnaire.;Long Term goal: The participant improves quality of Life and PHQ9 Scores as seen by post scores and/or verbalization of changes       Quality of Life Scores:  Quality of Life - 10/07/17 1727      Quality of Life Scores   Health/Function Pre  26.08 %     Health/Function Post  23.5 %    Health/Function % Change  -9.89 %    Socioeconomic Pre  23.25 %    Socioeconomic Post  23.36 %    Socioeconomic % Change   0.47 %    Psych/Spiritual Pre  23.57 %    Psych/Spiritual Post  23.57 %    Psych/Spiritual % Change  0 %    Family Pre  24.9 %    Family Post  22.8 %    Family % Change  -8.43 %    GLOBAL Pre  24.68 %    GLOBAL Post  23.38 %    GLOBAL % Change  -5.27 %      Scores of 19 and below usually indicate a poorer quality of life in these areas.  A difference of  2-3 points is a clinically meaningful difference.  A difference of 2-3 points in the total score of the Quality of Life Index has been associated with significant improvement in overall quality of life, self-image, physical symptoms, and general health in studies assessing change in quality of life.  PHQ-9: Recent Review Flowsheet Data    Depression screen [facility] 2/9 08/16/2017   Decreased Interest 0   Down, Depressed, Hopeless 0   PHQ - 2 Score 0   Altered sleeping 1   Tired, decreased energy 0   Change in appetite 0   Feeling bad or failure about yourself  1   Trouble concentrating 0   Moving slowly or fidgety/restless 0   Suicidal thoughts 0   PHQ-9 Score 2     Interpretation of Total Score  Total Score Depression Severity:  1-4 = Minimal depression, 5-9 = Mild depression, 10-14 = Moderate depression, 15-19 = Moderately severe depression, 20-27 = Severe depression   Psychosocial Evaluation and Intervention: Psychosocial Evaluation - 08/25/17 1659      Psychosocial Evaluation & Interventions   Interventions  Encouraged to exercise with the program and follow exercise prescription;Stress management education;Relaxation education    Comments  Counselor met with Mr. Goracke Merry Proud) today for initial psychosocial evaluation.  He is a 52 year old who had (4) stents inserted on 12/21 after discovering blockages.  He has a strong support system with a spouse of 48 years and (2)  adult children who live locally.  He also has a brother and a sister close by.  Merry Proud reports sleeping well and has a good appetite.  He denies a history of depression or anxiety or any current symptoms.  He states he has minimal stress in his life and is typically in a positive mood most of the time.  Merry Proud has goals to be educated more on cardiac issues and to get in the habit of exercising consistently.  Staff will follow with him throughout the course of this program.      Expected Outcomes  Merry Proud will benefit from consistent exercise to achieve his stated goals.  The educational and psychoeducational components of this program will be helpful in learning and understanding his condition more and coping more positively with it.      Continue Psychosocial Services   Follow up required by staff  Psychosocial Re-Evaluation: Psychosocial Re-Evaluation    [place] Name 09/23/17 1612             Psychosocial Re-Evaluation   Current issues with  Current Stress Concerns       Comments  Merry Proud states he is able to do more of the work around the yard and home with no problems.       Expected Outcomes  Short - Continue to exercise  Long - maintain fitness on his own       Interventions  Encouraged to attend Cardiac Rehabilitation for the exercise       Continue Psychosocial Services   Follow up required by staff          Psychosocial Discharge (Final Psychosocial Re-Evaluation): Psychosocial Re-Evaluation - 09/23/17 1612      Psychosocial Re-Evaluation   Current issues with  Current Stress Concerns    Comments  Merry Proud states he is able to do more of the work around the yard and home with no problems.    Expected Outcomes  Short - Continue to exercise  Long - maintain fitness on his own    Interventions  Encouraged to attend Cardiac Rehabilitation for the exercise    Continue Psychosocial Services   Follow up required by staff       Vocational Rehabilitation: Provide vocational rehab assistance  to qualifying candidates.   Vocational Rehab Evaluation & Intervention: Vocational Rehab - 08/16/17 1251      Initial Vocational Rehab Evaluation & Intervention   Assessment shows need for Vocational Rehabilitation  No       Education: Education Goals: Education classes will be provided on a variety of topics geared toward better understanding of heart health and risk factor modification. Participant will state understanding/return demonstration of topics presented as noted by education test scores.  Learning Barriers/Preferences: Learning Barriers/Preferences - 08/16/17 1250      Learning Barriers/Preferences   Learning Barriers  Sight Needs reading glasses to read forms    Learning Preferences  Skilled Demonstration;Verbal Instruction       Education Topics:  AED/CPR: - Group verbal and written instruction with the use of models to demonstrate the basic use of the AED with the basic ABC's of resuscitation.   Cardiac Rehab from 10/11/2017 in [facility] Cardiac and Pulmonary Rehab  Date  09/20/17  Educator  MA  Instruction Review Code  1- Verbalizes Understanding      General Nutrition Guidelines/Fats and Fiber: -Group instruction provided by verbal, written material, models and posters to present the general guidelines for heart healthy nutrition. Gives an explanation and review of dietary fats and fiber.   Cardiac Rehab from 10/11/2017 in [facility] Cardiac and Pulmonary Rehab  Date  09/06/17  Educator  PI  Instruction Review Code  1- Verbalizes Understanding      Controlling Sodium/Reading Food Labels: -Group verbal and written material supporting the discussion of sodium use in heart healthy nutrition. Review and explanation with models, verbal and written materials for utilization of the food label.   Cardiac Rehab from 10/11/2017 in [facility] Cardiac and Pulmonary Rehab  Date  09/13/17  Educator  PI  Instruction Review Code  1- Verbalizes Understanding      Exercise Physiology &  General Exercise Guidelines: - Group verbal and written instruction with models to review the exercise physiology of the cardiovascular system and associated critical values. Provides general exercise guidelines with specific guidelines to those with heart or lung disease.    Cardiac Rehab from 10/11/2017 in  [place] Cardiac and Pulmonary Rehab  Date  09/27/17  Educator  [facility]  Instruction Review Code  1- Verbalizes Understanding      Aerobic Exercise & Resistance Training: - Gives group verbal and written instruction on the various components of exercise. Focuses on aerobic and resistive training programs and the benefits of this training and how to safely progress through these programs..   Cardiac Rehab from 10/11/2017 in [facility] Cardiac and Pulmonary Rehab  Date  10/04/17  Educator  AS  Instruction Review Code  1- Verbalizes Understanding      Flexibility, Balance, Mind/Body Relaxation: Provides group verbal/written instruction on the benefits of flexibility and balance training, including mind/body exercise modes such as yoga, pilates and tai chi.  Demonstration and skill practice provided.   Cardiac Rehab from 10/11/2017 in [facility] Cardiac and Pulmonary Rehab  Date  10/06/17  Educator  AS  Instruction Review Code  1- Verbalizes Understanding      Stress and Anxiety: - Provides group verbal and written instruction about the health risks of elevated stress and causes of high stress.  Discuss the correlation between heart/lung disease and anxiety and treatment options. Review healthy ways to manage with stress and anxiety.   Cardiac Rehab from 10/11/2017 in [facility] Cardiac and Pulmonary Rehab  Date  09/01/17  Educator  [facility]  Instruction Review Code  1- Verbalizes Understanding      Depression: - Provides group verbal and written instruction on the correlation between heart/lung disease and depressed mood, treatment options, and the stigmas associated with seeking treatment.   Cardiac Rehab from  10/11/2017 in [facility] Cardiac and Pulmonary Rehab  Date  09/29/17  Educator  [facility]  Instruction Review Code  1- Verbalizes Understanding      Anatomy & Physiology of the Heart: - Group verbal and written instruction and models provide basic cardiac anatomy and physiology, with the coronary electrical and arterial systems. Review of Valvular disease and Heart Failure   Cardiac Rehab from 10/11/2017 in [facility] Cardiac and Pulmonary Rehab  Date  08/30/17  Educator  CE  Instruction Review Code  1- Verbalizes Understanding      Cardiac Procedures: - Group verbal and written instruction to review commonly prescribed medications for heart disease. Reviews the medication, class of the drug, and side effects. Includes the steps to properly store meds and maintain the prescription regimen. (beta blockers and nitrates)   Cardiac Rehab from 10/11/2017 in [facility] Cardiac and Pulmonary Rehab  Date  09/08/17  Educator  [facility]  Instruction Review Code  1- Verbalizes Understanding      Cardiac Medications I: - Group verbal and written instruction to review commonly prescribed medications for heart disease. Reviews the medication, class of the drug, and side effects. Includes the steps to properly store meds and maintain the prescription regimen.   Cardiac Rehab from 10/11/2017 in [facility] Cardiac and Pulmonary Rehab  Date  08/23/17 [Part 2 08/25/17 CE]  Educator  [facility]  Instruction Review Code  1- Verbalizes Understanding      Cardiac Medications II: -Group verbal and written instruction to review commonly prescribed medications for heart disease. Reviews the medication, class of the drug, and side effects. (all other drug classes)   Cardiac Rehab from 10/11/2017 in [facility] Cardiac and Pulmonary Rehab  Date  10/11/17  Educator  CE  Instruction Review Code  1- Verbalizes Understanding       Go Sex-Intimacy & Heart Disease, Get SMART - Goal Setting: - Group verbal and written instruction through game  format to discuss heart  disease and the return to sexual intimacy. Provides group verbal and written material to discuss and apply goal setting through the application of the S.M.A.R.T. Method.   Cardiac Rehab from 10/11/2017 in [facility] Cardiac and Pulmonary Rehab  Date  09/08/17  Educator  [facility]  Instruction Review Code  1- Verbalizes Understanding      Other Matters of the Heart: - Provides group verbal, written materials and models to describe Stable Angina and Peripheral Artery. Includes description of the disease process and treatment options available to the cardiac patient.   Exercise & Equipment Safety: - Individual verbal instruction and demonstration of equipment use and safety with use of the equipment.   Cardiac Rehab from 10/11/2017 in [facility] Cardiac and Pulmonary Rehab  Date  08/16/17  Educator  [place]  Instruction Review Code  1- Verbalizes Understanding      Infection Prevention: - Provides verbal and written material to individual with discussion of infection control including proper hand washing and proper equipment cleaning during exercise session.   Cardiac Rehab from 10/11/2017 in [facility] Cardiac and Pulmonary Rehab  Date  08/16/17  Educator  [place]  Instruction Review Code  1- Verbalizes Understanding      Falls Prevention: - Provides verbal and written material to individual with discussion of falls prevention and safety.   Cardiac Rehab from 10/11/2017 in [facility] Cardiac and Pulmonary Rehab  Date  08/16/17  Educator  [place]  Instruction Review Code  1- Verbalizes Understanding      Diabetes: - Individual verbal and written instruction to review signs/symptoms of diabetes, desired ranges of glucose level fasting, after meals and with exercise. Acknowledge that pre and post exercise glucose checks will be done for 3 sessions at entry of program.   Know Your Numbers and Risk Factors: -Group verbal and written instruction about important numbers in your health.  Discussion of what are risk factors and how they  play a role in the disease process.  Review of Cholesterol, Blood Pressure, Diabetes, and BMI and the role they play in your overall health.   Cardiac Rehab from 10/11/2017 in [facility] Cardiac and Pulmonary Rehab  Date  10/11/17  Educator  CE  Instruction Review Code  1- Verbalizes Understanding      Sleep Hygiene: -Provides group verbal and written instruction about how sleep can affect your health.  Define sleep hygiene, discuss sleep cycles and impact of sleep habits. Review good sleep hygiene tips.    Cardiac Rehab from 10/11/2017 in [facility] Cardiac and Pulmonary Rehab  Date  09/15/17  Educator  [facility]  Instruction Review Code  1- Verbalizes Understanding      Other: -Provides group and verbal instruction on various topics (see comments)   Knowledge Questionnaire Score: Knowledge Questionnaire Score - 10/07/17 1722      Knowledge Questionnaire Score   Pre Score  22/28    Post Score  26/28 reviewed with patient       Core Components/Risk Factors/Patient Goals at Admission: Personal Goals and Risk Factors at Admission - 08/16/17 1228      Core Components/Risk Factors/Patient Goals on Admission    Weight Management  Weight Maintenance;Yes    Intervention  Weight Management: Provide education and appropriate resources to help participant work on and attain dietary goals.;Weight Management: Develop a combined nutrition and exercise program designed to reach desired caloric intake, while maintaining appropriate intake of nutrient and fiber, sodium and fats, and appropriate energy expenditure required for the weight goal.  Admit Weight  170 lb 14.4 oz (77.5 kg)    Goal Weight: Short Term  168 lb (76.2 kg)    Goal Weight: Long Term  165 lb (74.8 kg)    Expected Outcomes  Weight Maintenance: Understanding of the daily nutrition guidelines, which includes 25-35% calories from fat, 7% or less cal from saturated fats, less than 275m cholesterol, less than 1.5gm of sodium, & 5 or more servings of  fruits and vegetables daily;Short Term: Continue to assess and modify interventions until short term weight is achieved    Improve shortness of breath with ADL's  Yes states he has always gotten SOB since younger if he did anything more exertional than walking. He would like to build up endurance to tolerate physical activity more.     Intervention  Provide education, individualized exercise plan and daily activity instruction to help decrease symptoms of SOB with activities of daily living.    Expected Outcomes  Short Term: Achieves a reduction of symptoms when performing activities of daily living.    Hypertension  Yes    Intervention  Provide education on lifestyle modifcations including regular physical activity/exercise, weight management, moderate sodium restriction and increased consumption of fresh fruit, vegetables, and low fat dairy, alcohol moderation, and smoking cessation.;Monitor prescription use compliance.    Expected Outcomes  Short Term: Continued assessment and intervention until BP is < 140/959mHG in hypertensive participants. < 130/8085mG in hypertensive participants with diabetes, heart failure or chronic kidney disease.;Long Term: Maintenance of blood pressure at goal levels.    Lipids  Yes    Intervention  Provide education and support for participant on nutrition & aerobic/resistive exercise along with prescribed medications to achieve LDL <23m64mDL >40mg59m Expected Outcomes  Short Term: Participant states understanding of desired cholesterol values and is compliant with medications prescribed. Participant is following exercise prescription and nutrition guidelines.;Long Term: Cholesterol controlled with medications as prescribed, with individualized exercise RX and with personalized nutrition plan. Value goals: LDL < 23mg,36m > 40 mg.    Personal Goal Other  Yes    Personal Goal  Develop a consistent exercise routine to continue outside of the program.     Intervention   Discuss home exercises with patient and encourage him to join a gym.    Expected Outcomes  Patient will continue with exercise at home or at a gym outside of days he comes to class and will continue once he completes the program.        Core Components/Risk Factors/Patient Goals Review:  Goals and Risk Factor Review    Row Name 09/23/17 1609             Core Components/Risk Factors/Patient Goals Review   Personal Goals Review  Lipids;Hypertension       Review  Still taking all meds for BP and cholesterol.  He has been drinking more water.       Expected Outcomes  Short - sees cardiologist late March/April Long - continue healthy lifestyle habits          Core Components/Risk Factors/Patient Goals at Discharge (Final Review):  Goals and Risk Factor Review - 09/23/17 1609      Core Components/Risk Factors/Patient Goals Review   Personal Goals Review  Lipids;Hypertension    Review  Still taking all meds for BP and cholesterol.  He has been drinking more water.    Expected Outcomes  Short - sees cardiologist late March/April Long - continue healthy lifestyle habits  ITP Comments: ITP Comments    Row Name 08/16/17 1225 09/08/17 0641 10/06/17 0607       ITP Comments  Medical Review Completed; initial ITP created. Diagnosis Documentation can be found in [facility] encounter dated 08/12/2017.  30 Day review. Continue with ITP unless directed changes per Medical Director review.  New to program  30 day review. Continue with ITP unless directed changes per Medical Director review.        Comments: Discharge ITP

## 2017-10-12 NOTE — Progress Notes (Signed)
Discharge Progress Report  Patient Details  Name: Jerome Hart MRN: [id number] Date of Birth: August 14, 1965 Referring Provider:     Cardiac Rehab from 08/16/2017 in [facility] Cardiac and Pulmonary Rehab  Referring Provider  Kandis Cocking MD       Number of Visits: 36  Reason for Discharge:  Patient reached a stable level of exercise. Patient independent in their exercise. Patient has met program and personal goals.  Smoking History:  Social History   Tobacco Use  Smoking Status Never Smoker  Smokeless Tobacco Never Used    Diagnosis:  Status post coronary artery stent placement  ADL UCSD:   Initial Exercise Prescription: Initial Exercise Prescription - 08/16/17 1300      Date of Initial Exercise RX and Referring Provider   Date  08/16/17    Referring Provider  Kandis Cocking MD      Treadmill   MPH  3.4    Grade  3    Minutes  15    METs  5.01      NuStep   Level  4    SPM  80    Minutes  15    METs  3      REL-XR   Level  3    Speed  50    Minutes  15    METs  3      Prescription Details   Frequency (times per week)  3    Duration  Progress to 45 minutes of aerobic exercise without signs/symptoms of physical distress      Intensity   THRR 40-80% of Max Heartrate  101-145    Ratings of Perceived Exertion  11-13    Perceived Dyspnea  0-4      Progression   Progression  Continue to progress workloads to maintain intensity without signs/symptoms of physical distress.      Resistance Training   Training Prescription  Yes    Weight  4 lbs    Reps  10-15       Discharge Exercise Prescription (Final Exercise Prescription Changes): Exercise Prescription Changes - 09/28/17 1500      Response to Exercise   Blood Pressure (Admit)  124/62    Blood Pressure (Exercise)  146/60    Blood Pressure (Exit)  122/62    Heart Rate (Admit)  89 bpm    Heart Rate (Exercise)  125 bpm    Heart Rate (Exit)  79 bpm    Rating of Perceived Exertion (Exercise)  12    Symptoms   none    Duration  Continue with 45 min of aerobic exercise without signs/symptoms of physical distress.    Intensity  THRR unchanged      Progression   Progression  Continue to progress workloads to maintain intensity without signs/symptoms of physical distress.    Average METs  4.6      Resistance Training   Training Prescription  Yes    Weight  4 lb    Reps  10-15      Treadmill   MPH  3.5    Grade  3.5    Minutes  15    METs  5.61      NuStep   Level  5    SPM  80    Minutes  15    METs  3.7      Home Exercise Plan   Plans to continue exercise at  Home (comment) walk    Frequency  Add  1 additional day to program exercise sessions.    Initial Home Exercises Provided  09/01/17       Functional Capacity: 6 Minute Walk    Row Name 08/16/17 1308 09/30/17 1743       6 Minute Walk   Phase  Initial  Discharge    Distance  1802 feet  2040 feet    Distance % Change  -  13 %    Distance Feet Change  -  238 ft    Walk Time  6 minutes  6 minutes    # of Rest Breaks  0  0    MPH  3.41  3.86    METS  5.39  6.08    RPE  11  13    Perceived Dyspnea   -  1    VO2 Peak  18.85  21.3    Symptoms  No  No    Resting HR  57 bpm  75 bpm    Resting BP  138/58  122/58    Resting Oxygen Saturation   99 %  -    Exercise Oxygen Saturation  during 6 min walk  99 %  -    Max Ex. HR  136 bpm  150 bpm    Max Ex. BP  146/64  158/68    2 Minute Post BP  132/60  -       Psychological, QOL, Others - Outcomes: PHQ 2/9: Depression screen PHQ 2/9 08/16/2017  Decreased Interest 0  Down, Depressed, Hopeless 0  PHQ - 2 Score 0  Altered sleeping 1  Tired, decreased energy 0  Change in appetite 0  Feeling bad or failure about yourself  1  Trouble concentrating 0  Moving slowly or fidgety/restless 0  Suicidal thoughts 0  PHQ-9 Score 2    Quality of Life: Quality of Life - 10/07/17 1727      Quality of Life Scores   Health/Function Pre  26.08 %    Health/Function Post  23.5 %     Health/Function % Change  -9.89 %    Socioeconomic Pre  23.25 %    Socioeconomic Post  23.36 %    Socioeconomic % Change   0.47 %    Psych/Spiritual Pre  23.57 %    Psych/Spiritual Post  23.57 %    Psych/Spiritual % Change  0 %    Family Pre  24.9 %    Family Post  22.8 %    Family % Change  -8.43 %    GLOBAL Pre  24.68 %    GLOBAL Post  23.38 %    GLOBAL % Change  -5.27 %       Personal Goals: Goals established at orientation with interventions provided to work toward goal. Personal Goals and Risk Factors at Admission - 08/16/17 1228      Core Components/Risk Factors/Patient Goals on Admission    Weight Management  Weight Maintenance;Yes    Intervention  Weight Management: Provide education and appropriate resources to help participant work on and attain dietary goals.;Weight Management: Develop a combined nutrition and exercise program designed to reach desired caloric intake, while maintaining appropriate intake of nutrient and fiber, sodium and fats, and appropriate energy expenditure required for the weight goal.    Admit Weight  170 lb 14.4 oz (77.5 kg)    Goal Weight: Short Term  168 lb (76.2 kg)    Goal Weight: Long Term  165 lb (74.8 kg)  Expected Outcomes  Weight Maintenance: Understanding of the daily nutrition guidelines, which includes 25-35% calories from fat, 7% or less cal from saturated fats, less than 25m cholesterol, less than 1.5gm of sodium, & 5 or more servings of fruits and vegetables daily;Short Term: Continue to assess and modify interventions until short term weight is achieved    Improve shortness of breath with ADL's  Yes states he has always gotten SOB since younger if he did anything more exertional than walking. He would like to build up endurance to tolerate physical activity more.     Intervention  Provide education, individualized exercise plan and daily activity instruction to help decrease symptoms of SOB with activities of daily living.     Expected Outcomes  Short Term: Achieves a reduction of symptoms when performing activities of daily living.    Hypertension  Yes    Intervention  Provide education on lifestyle modifcations including regular physical activity/exercise, weight management, moderate sodium restriction and increased consumption of fresh fruit, vegetables, and low fat dairy, alcohol moderation, and smoking cessation.;Monitor prescription use compliance.    Expected Outcomes  Short Term: Continued assessment and intervention until BP is < 140/956mHG in hypertensive participants. < 130/8029mG in hypertensive participants with diabetes, heart failure or chronic kidney disease.;Long Term: Maintenance of blood pressure at goal levels.    Lipids  Yes    Intervention  Provide education and support for participant on nutrition & aerobic/resistive exercise along with prescribed medications to achieve LDL <79m53mDL >40mg71m Expected Outcomes  Short Term: Participant states understanding of desired cholesterol values and is compliant with medications prescribed. Participant is following exercise prescription and nutrition guidelines.;Long Term: Cholesterol controlled with medications as prescribed, with individualized exercise RX and with personalized nutrition plan. Value goals: LDL < 79mg,67m > 40 mg.    Personal Goal Other  Yes    Personal Goal  Develop a consistent exercise routine to continue outside of the program.     Intervention  Discuss home exercises with patient and encourage him to join a gym.    Expected Outcomes  Patient will continue with exercise at home or at a gym outside of days he comes to class and will continue once he completes the program.         Personal Goals Discharge: Goals and Risk Factor Review    Row Name 09/23/17 1609             Core Components/Risk Factors/Patient Goals Review   Personal Goals Review  Lipids;Hypertension       Review  Still taking all meds for BP and cholesterol.  He  has been drinking more water.       Expected Outcomes  Short - sees cardiologist late March/April Long - continue healthy lifestyle habits          Exercise Goals and Review: Exercise Goals    Row Name 08/16/17 1351             Exercise Goals   Increase Physical Activity  Yes       Intervention  Provide advice, education, support and counseling about physical activity/exercise needs.;Develop an individualized exercise prescription for aerobic and resistive training based on initial evaluation findings, risk stratification, comorbidities and participant's personal goals.       Expected Outcomes  Achievement of increased cardiorespiratory fitness and enhanced flexibility, muscular endurance and strength shown through measurements of functional capacity and personal statement of participant.  Increase Strength and Stamina  Yes       Intervention  Provide advice, education, support and counseling about physical activity/exercise needs.;Develop an individualized exercise prescription for aerobic and resistive training based on initial evaluation findings, risk stratification, comorbidities and participant's personal goals.       Expected Outcomes  Achievement of increased cardiorespiratory fitness and enhanced flexibility, muscular endurance and strength shown through measurements of functional capacity and personal statement of participant.       Able to understand and use rate of perceived exertion (RPE) scale  Yes       Intervention  Provide education and explanation on how to use RPE scale       Expected Outcomes  Short Term: Able to use RPE daily in rehab to express subjective intensity level;Long Term:  Able to use RPE to guide intensity level when exercising independently       Knowledge and understanding of Target Heart Rate Range (THRR)  Yes       Intervention  Provide education and explanation of THRR including how the numbers were predicted and where they are located for reference        Expected Outcomes  Short Term: Able to state/look up THRR;Long Term: Able to use THRR to govern intensity when exercising independently;Short Term: Able to use daily as guideline for intensity in rehab       Able to check pulse independently  Yes       Intervention  Provide education and demonstration on how to check pulse in carotid and radial arteries.;Review the importance of being able to check your own pulse for safety during independent exercise       Expected Outcomes  Short Term: Able to explain why pulse checking is important during independent exercise;Long Term: Able to check pulse independently and accurately       Understanding of Exercise Prescription  Yes       Intervention  Provide education, explanation, and written materials on patient's individual exercise prescription       Expected Outcomes  Short Term: Able to explain program exercise prescription;Long Term: Able to explain home exercise prescription to exercise independently          Nutrition & Weight - Outcomes: Pre Biometrics - 08/16/17 1351      Pre Biometrics   Height  5' 9.2" (1.758 m)    Weight  170 lb 14.4 oz (77.5 kg)    Waist Circumference  36 inches    Hip Circumference  39 inches    Waist to Hip Ratio  0.92 %    BMI (Calculated)  25.08    Single Leg Stand  30 seconds      Post Biometrics - 09/30/17 1744       Post  Biometrics   Height  5' 9.2" (1.758 m)    Weight  165 lb 6.4 oz (75 kg)    Waist Circumference  36 inches    Hip Circumference  39 inches    Waist to Hip Ratio  0.92 %    BMI (Calculated)  24.28    Single Leg Stand  30 seconds       Nutrition: Nutrition Therapy & Goals - 10/07/17 1743      Nutrition Therapy   Diet  TLC    Drug/Food Interactions  Statins/Certain Fruits    Protein (specify units)  9oz    Fiber  30 grams    Whole Grain Foods  3 servings    Saturated  Fats  15 max. grams    Fruits and Vegetables  5 servings/day    Sodium  2000 grams      Personal  Nutrition Goals   Personal Goal #2  Include healthy snacks such as fruits, nuts, popcorn (lowfat/ salt), homemade trail mix, Triscuit crackers, sunchips in place of regular chips.    Additional Goals?  No       Nutrition Discharge: Nutrition Assessments - 10/07/17 1721      MEDFICTS Scores   Pre Score  48       Education Questionnaire Score: Knowledge Questionnaire Score - 10/07/17 1722      Knowledge Questionnaire Score   Pre Score  22/28    Post Score  26/28 reviewed with patient       Goals reviewed with patient; copy given to patient.

## 2017-11-14 ENCOUNTER — Ambulatory Visit
Admission: EM | Admit: 2017-11-14 | Discharge: 2017-11-14 | Disposition: A | Discharge status: Home / Self Care | Payer: 59 | Attending: Family Medicine | Admitting: Family Medicine

## 2017-11-14 ENCOUNTER — Other Ambulatory Visit: Payer: Self-pay

## 2017-11-14 ENCOUNTER — Encounter: Payer: Self-pay | Admitting: Emergency Medicine

## 2017-11-14 DIAGNOSIS — J0100 Acute maxillary sinusitis, unspecified: Secondary | ICD-10-CM | POA: Diagnosis not present

## 2017-11-14 DIAGNOSIS — J209 Acute bronchitis, unspecified: Secondary | ICD-10-CM | POA: Diagnosis not present

## 2017-11-14 HISTORY — DX: Atherosclerotic heart disease of native coronary artery without angina pectoris: I25.10

## 2017-11-14 MED ORDER — AMOXICILLIN-POT CLAVULANATE 875-125 MG PO TABS: 1.0000 | ORAL_TABLET | Freq: Two times a day (BID) | ORAL | 0 refills | Status: AC

## 2017-11-14 NOTE — Discharge Instructions (Addendum)
Recommend start Augmentin  twice a day as directed. May continue OTC Mucinex 1-2 tablets every 12 hours as needed. May also increase fluid intake to help loosen up mucus. May start OTC Zyrtec  or Claritin  daily to help with drainage. Follow-up here in 4 to 5 days if not improving.

## 2017-11-14 NOTE — ED Triage Notes (Signed)
Patient c/o cough and congestion for a week.   

## 2017-11-14 NOTE — ED Provider Notes (Signed)
[facility]    CSN: [id number] Arrival date & time: 11/14/17  0834     History   Chief Complaint Chief Complaint  Patient presents with  . Cough    HPI Jerome Hart is a 52 y.o. male.   [age] year old male presents with nasal congestion and cough for over 1 week. Also having a low grade fever (99.5) for the past 2 to 3 days. Now having more sinus pressure and chest congestion with greenish mucus as well as nausea due to the drainage. Denies any vomiting or diarrhea. Has taken Tylenol sinus meds and Mucinex with minimal relief. Other chronic health issues include heart disease- just had 4 stents placed 4 months ago- currently on Plavix, aspirin and Lipitor daily.   The history is provided by the patient.    Past Medical History:  Diagnosis Date  . Coronary artery disease     Patient Active Problem List   Diagnosis Date Noted  . Coronary artery disease involving native coronary artery of native heart without angina pectoris 08/11/2017  . Elevated blood pressure reading 08/11/2017  . Abnormal stress test 07/28/2017  . Angina pectoris (HCC) 07/28/2017    Past Surgical History:  Procedure Laterality Date  . APPENDECTOMY    . CARDIAC SURGERY    . TONSILLECTOMY         Home Medications    Prior to Admission medications   Medication Sig Start Date End Date Taking? Authorizing Provider  aspirin EC 81 MG tablet Take 81 mg by mouth. 07/22/17 07/22/18 Yes [provider]  atorvastatin (LIPITOR) 80 MG tablet Take 80 mg by mouth. 07/30/17 11/14/17 Yes [provider]  clopidogrel (PLAVIX) 75 MG tablet Take 75 mg by mouth daily.   Yes [provider]  amoxicillin-clavulanate (AUGMENTIN) 875-125 MG tablet Take 1 tablet by mouth every 12 (twelve) hours for 7 days. 11/14/17 11/21/17  Sudie GrumblingAmyot, Ann Berry, NP  nitroGLYCERIN (NITROSTAT) 0.4 MG SL tablet Place 1 tablet (0.4 mg total) under the tongue every 5 (five) minutes as needed for chest pain. To  ED if pain persists after 3 doses 07/21/17   Payton Mccallumonty, [place], MD    Family History Family History  Problem Relation Age of Onset  . Cancer Father     Social History Social History   Tobacco Use  . Smoking status: Never Smoker  . Smokeless tobacco: Never Used  Substance Use Topics  . Alcohol use: Yes    Frequency: Never    Comment: rarely  . Drug use: No     Allergies   Compazine [prochlorperazine edisylate]   Review of Systems Review of Systems  Constitutional: Positive for appetite change, fatigue and fever. Negative for activity change and chills.  HENT: Positive for congestion, postnasal drip, rhinorrhea, sinus pressure and sore throat. Negative for ear discharge, ear pain, mouth sores, nosebleeds, sinus pain and trouble swallowing.   Eyes: Negative for pain, discharge, redness and itching.  Respiratory: Positive for cough and wheezing. Negative for chest tightness and shortness of breath.   Cardiovascular: Negative for chest pain and palpitations.  Gastrointestinal: Positive for nausea. Negative for abdominal pain, diarrhea and vomiting.  Musculoskeletal: Negative for arthralgias, myalgias, neck pain and neck stiffness.  Skin: Negative for rash and wound.  Neurological: Positive for headaches. Negative for dizziness, tremors, seizures, syncope, weakness, light-headedness and numbness.  Hematological: Negative for adenopathy. Does not bruise/bleed easily.  Psychiatric/Behavioral: Negative.      Physical Exam Triage Vital Signs ED Triage Vitals [  11/14/17 0841]  Enc Vitals Group     BP 127/78     Pulse Rate 65     Resp 16     Temp 98 F (36.7 C)     Temp Source Oral     SpO2 96 %     Weight 170 lb (77.1 kg)     Height 5\' 10"  (1.778 m)     Head Circumference      Peak Flow      Pain Score 3     Pain Loc      Pain Edu?      Excl. in GC?    No data found.  Updated Vital Signs BP 127/78 (BP Location: Left Arm)   Pulse 65   Temp 98 F (36.7 C) (Oral)    Resp 16   Ht 5\' 10"  (1.778 m)   Wt 170 lb (77.1 kg)   SpO2 96%   BMI 24.39 kg/m   Visual Acuity Right Eye Distance:   Left Eye Distance:   Bilateral Distance:    Right Eye Near:   Left Eye Near:    Bilateral Near:     Physical Exam  Constitutional: He is oriented to person, place, and time. He appears well-developed and well-nourished. No distress.  Patient sitting comfortably on exam table in no acute distress.   HENT:  Head: Normocephalic and atraumatic.  Right Ear: Hearing, tympanic membrane, external ear and ear canal normal.  Left Ear: Hearing, tympanic membrane, external ear and ear canal normal.  Nose: Mucosal edema and rhinorrhea present. Right sinus exhibits maxillary sinus tenderness. Right sinus exhibits no frontal sinus tenderness. Left sinus exhibits maxillary sinus tenderness. Left sinus exhibits no frontal sinus tenderness.  Mouth/Throat: Uvula is midline and mucous membranes are normal. Oropharyngeal exudate (slight yellow post nasal drainge present) and posterior oropharyngeal erythema present.  Eyes: Conjunctivae and EOM are normal.  Neck: Normal range of motion. Neck supple.  Cardiovascular: Normal rate, regular rhythm and normal heart sounds.  No murmur heard. Pulmonary/Chest: Effort normal. No stridor. No respiratory distress. He has no decreased breath sounds. He has wheezes in the right upper field, the right lower field, the left upper field and the left lower field. He has rhonchi in the right upper field and the left upper field. He has no rales.  Musculoskeletal: Normal range of motion.  Lymphadenopathy:    He has no cervical adenopathy.  Neurological: He is alert and oriented to person, place, and time.  Skin: Skin is warm and dry. Capillary refill takes less than 2 seconds. No rash noted.  Psychiatric: He has a normal mood and affect. His behavior is normal. Judgment and thought content normal.  Vitals reviewed.    UC Treatments / Results   Labs (all labs ordered are listed, but only abnormal results are displayed) Labs Reviewed - No data to display  EKG None Radiology No results found.  Procedures Procedures (including critical care time)  Medications Ordered in UC Medications - No data to display   Initial Impression / Assessment and Plan / UC Course  I have reviewed the triage vital signs and the nursing notes.  Pertinent labs & imaging results that were available during my care of the patient were reviewed by me and considered in my medical decision making (see chart for details).    Discussed with patient that he probably has a mild sinus infection and bronchitis. Recommend start Augmentin 875mg  twice a day as directed. May continue OTC Mucinex  1-2 tablets every 12 hours as needed. May also increase fluid intake to help loosen up mucus. May start OTC Zyrtec 10mg  or Claritin 10mg  daily to help with drainage. Follow-up here in 4 to 5 days if not improving.    Final Clinical Impressions(s) / UC Diagnoses   Final diagnoses:  Acute non-recurrent maxillary sinusitis  Acute bronchitis, unspecified organism    ED Discharge Orders        Ordered    amoxicillin-clavulanate (AUGMENTIN) 875-125 MG tablet  Every 12 hours     11/14/17 0902       Controlled Substance Prescriptions [place] Controlled Substance Registry consulted? Not Applicable   Sudie Grumbling, NP 11/14/17 [phone]

## 2020-08-08 ENCOUNTER — Ambulatory Visit
Admission: EM | Admit: 2020-08-08 | Discharge: 2020-08-08 | Disposition: A | Discharge status: Home / Self Care | Payer: BC Managed Care – PPO | Attending: Physician Assistant | Admitting: Physician Assistant

## 2020-08-08 ENCOUNTER — Other Ambulatory Visit: Payer: Self-pay

## 2020-08-08 ENCOUNTER — Encounter: Payer: Self-pay | Admitting: Emergency Medicine

## 2020-08-08 DIAGNOSIS — R059 Cough, unspecified: Secondary | ICD-10-CM | POA: Diagnosis not present

## 2020-08-08 DIAGNOSIS — U071 COVID-19: Secondary | ICD-10-CM | POA: Diagnosis not present

## 2020-08-08 HISTORY — DX: Hyperlipidemia, unspecified: E78.5

## 2020-08-08 LAB — RESP PANEL BY RT-PCR (FLU A&B, COVID) ARPGX2
Influenza A by PCR: NEGATIVE
Influenza B by PCR: NEGATIVE
SARS Coronavirus 2 by RT PCR: POSITIVE — AB

## 2020-08-08 NOTE — ED Provider Notes (Addendum)
[facility]    CSN: [id number] Arrival date & time: 08/08/20  1832      History   Chief Complaint Chief Complaint  Patient presents with  . Cough  . Nasal Congestion  . Headache    HPI Jerome Hart is a 54 y.o. male presenting for 2-day history of cough, nasal congestion, and headaches.  Patient's wife is sick with similar symptoms.  Patient has been taking over-the-counter Alka-Seltzer.  He was exposed to influenza over the holiday.  He has been vaccinated COVID-19. Patient denies any fever, weakness, sore throat, chest pain, difficulty breathing, abdominal pain, N/V/D or changes in smell and taste. Medical history significant for CAD and hyperlipidemia.  Patient states he had stents placed in 2018. Patient says he does not really feel too badly. He has no other complaints or concerns.  HPI  Past Medical History:  Diagnosis Date  . Coronary artery disease   . Hyperlipidemia     Patient Active Problem List   Diagnosis Date Noted  . Coronary artery disease involving native coronary artery of native heart without angina pectoris 08/11/2017  . Elevated blood pressure reading 08/11/2017  . Abnormal stress test 07/28/2017  . Angina pectoris (HCC) 07/28/2017    Past Surgical History:  Procedure Laterality Date  . APPENDECTOMY    . CARDIAC SURGERY    . TONSILLECTOMY         Home Medications    Prior to Admission medications   Medication Sig Start Date End Date Taking? Authorizing Provider  aspirin 81 MG EC tablet Take 1 tablet by mouth daily. 07/22/17  Yes [provider]  atorvastatin (LIPITOR) 80 MG tablet Take 80 mg by mouth. 07/30/17 08/08/20 Yes [provider]  Multiple Vitamin (MULTI-VITAMIN) tablet Take 1 tablet by mouth daily.   Yes [provider]  nitroGLYCERIN (NITROSTAT) 0.4 MG SL tablet Place 1 tablet (0.4 mg total) under the tongue every 5 (five) minutes as needed for chest pain. To ED if pain persists after 3  doses 07/21/17  Yes Conty, Pamala Hurryrlando, MD  clopidogrel (PLAVIX) 75 MG tablet Take 75 mg by mouth daily.    [provider]    Family History Family History  Problem Relation Age of Onset  . Other Mother        natural causes  . Cancer Father     Social History Social History   Tobacco Use  . Smoking status: Never Smoker  . Smokeless tobacco: Never Used  Vaping Use  . Vaping Use: Never used  Substance Use Topics  . Alcohol use: Yes    Comment: rarely  . Drug use: No     Allergies   Prochlorperazine edisylate   Review of Systems Review of Systems  Constitutional: Negative for fatigue and fever.  HENT: Positive for congestion. Negative for rhinorrhea, sinus pressure, sinus pain and sore throat.   Respiratory: Positive for cough. Negative for shortness of breath.   Gastrointestinal: Negative for abdominal pain, diarrhea, nausea and vomiting.  Musculoskeletal: Negative for myalgias.  Neurological: Positive for headaches. Negative for weakness and light-headedness.  Hematological: Negative for adenopathy.     Physical Exam Triage Vital Signs ED Triage Vitals [08/08/20 1951]  Enc Vitals Group     BP 123/76     Pulse Rate (!) 58     Resp 18     Temp 98.5 F (36.9 C)     Temp Source Oral     SpO2 99 %  Weight 165 lb (74.8 kg)     Height 5\' 10"  (1.778 m)     Head Circumference      Peak Flow      Pain Score 0     Pain Loc      Pain Edu?      Excl. in GC?    No data found.  Updated Vital Signs BP 123/76 (BP Location: Left Arm)   Pulse (!) 58   Temp 98.5 F (36.9 C) (Oral)   Resp 18   Ht 5\' 10"  (1.778 m)   Wt 165 lb (74.8 kg)   SpO2 99%   BMI 23.68 kg/m       Physical Exam Vitals and nursing note reviewed.  Constitutional:      General: He is not in acute distress.    Appearance: Normal appearance. He is well-developed and well-nourished. He is not ill-appearing or diaphoretic.  HENT:     Head: Normocephalic and atraumatic.     Nose:  Congestion and rhinorrhea present.     Mouth/Throat:     Mouth: Oropharynx is clear and moist and mucous membranes are normal. Mucous membranes are moist.     Pharynx: Oropharynx is clear. Uvula midline. No oropharyngeal exudate.     Tonsils: No tonsillar abscesses.  Eyes:     General: No scleral icterus.       Right eye: No discharge.        Left eye: No discharge.     Extraocular Movements: EOM normal.     Conjunctiva/sclera: Conjunctivae normal.  Neck:     Thyroid: No thyromegaly.     Trachea: No tracheal deviation.  Cardiovascular:     Rate and Rhythm: Regular rhythm. Bradycardia present.     Heart sounds: Normal heart sounds.  Pulmonary:     Effort: Pulmonary effort is normal. No respiratory distress.     Breath sounds: Normal breath sounds. No wheezing, rhonchi or rales.  Musculoskeletal:     Cervical back: Normal range of motion and neck supple.  Lymphadenopathy:     Cervical: No cervical adenopathy.  Skin:    General: Skin is warm and dry.     Findings: No rash.  Neurological:     General: No focal deficit present.     Mental Status: He is alert. Mental status is at baseline.     Motor: No weakness.     Gait: Gait normal.  Psychiatric:        Mood and Affect: Mood normal.        Behavior: Behavior normal.        Thought Content: Thought content normal.      UC Treatments / Results  Labs (all labs ordered are listed, but only abnormal results are displayed) Labs Reviewed  RESP PANEL BY RT-PCR (FLU A&B, COVID) ARPGX2 - Abnormal; Notable for the following components:      Result Value   SARS Coronavirus 2 by RT PCR POSITIVE (*)    All other components within normal limits    EKG   Radiology No results found.  Procedures Procedures (including critical care time)  Medications Ordered in UC Medications - No data to display  Initial Impression / Assessment and Plan / UC Course  I have reviewed the triage vital signs and the nursing notes.  Pertinent  labs & imaging results that were available during my care of the patient were reviewed by me and considered in my medical decision making (see chart for details).  All vital signs are stable. Patient is well-appearing and in no acute distress. He does not appear ill. He has minor nasal congestion and rhinorrhea on exam. Chest clear to auscultation heart regular rate and rhythm.   Respiratory panel obtained and patient positive for COVID-19. CDC guidelines, isolation protocol, and ED precautions reviewed with patient. Advised supportive care with increasing rest and fluids. Advised continuing OTC decongestants. Advised patient that I have reached out to the infusion center for consideration of MAB therapy since he would be a good candidate, however the list is lengthy and patients are prioritized. Patient aware. Again, reviewed ED precautions with patient.   Final Clinical Impressions(s) / UC Diagnoses   Final diagnoses:  COVID-19  Cough     Discharge Instructions     +COVID  If symptomatic, go home and rest. Push fluids. Take Tylenol as needed for discomfort. Gargle warm salt water. Throat lozenges. Take Mucinex DM or Robitussin for cough. Humidifier in bedroom to ease coughing. Warm showers. Also review the COVID handout for more information.  COVID-19 INFECTION: The incubation period of COVID-19 is approximately 14 days after exposure, with most symptoms developing in roughly 4-5 days. Symptoms may range in severity from mild to critically severe. Roughly 80% of those infected will have mild symptoms. People of any age may become infected with COVID-19 and have the ability to transmit the virus. The most common symptoms include: fever, fatigue, cough, body aches, headaches, sore throat, nasal congestion, shortness of breath, nausea, vomiting, diarrhea, changes in smell and/or taste.    COURSE OF ILLNESS Some patients may begin with mild disease which can progress quickly into critical  symptoms. If your symptoms are worsening please call ahead to the Emergency Department and proceed there for further treatment. Recovery time appears to be roughly 1-2 weeks for mild symptoms and 3-6 weeks for severe disease.   GO IMMEDIATELY TO ER FOR FEVER YOU ARE UNABLE TO GET DOWN WITH TYLENOL, BREATHING PROBLEMS, CHEST PAIN, FATIGUE, LETHARGY, INABILITY TO EAT OR DRINK, ETC  QUARANTINE AND ISOLATION: To help decrease the spread of COVID-19 please remain isolated if you have COVID infection or are highly suspected to have COVID infection. This means -stay home and isolate to one room in the home if you live with others. Do not share a bed or bathroom with others while ill, sanitize and wipe down all countertops and keep common areas clean and disinfected. You may discontinue isolation if you have a mild case and are asymptomatic 10 days after symptom onset as long as you have been fever free >24 hours without having to take Motrin or Tylenol. If your case is more severe (meaning you develop pneumonia or are admitted in the hospital), you may have to isolate longer.   If you have been in close contact (within 6 feet) of someone diagnosed with COVID 19, you are advised to quarantine in your home for 14 days as symptoms can develop anywhere from 2-14 days after exposure to the virus. If you develop symptoms, you  must isolate.  Most current guidelines for COVID after exposure -isolate 10 days if you ARE NOT tested for COVID as long as symptoms do not develop -isolate 7 days if you are tested and remain asymptomatic -You do not necessarily need to be tested for COVID if you have + exposure and        develop   symptoms. Just isolate at home x10 days from symptom onset During this global pandemic, CDC advises to  practice social distancing, try to stay at least 38ft away from others at all times. Wear a face covering. Wash and sanitize your hands regularly and avoid going anywhere that is not  necessary.  KEEP IN MIND THAT THE COVID TEST IS NOT 100% ACCURATE AND YOU SHOULD STILL DO EVERYTHING TO PREVENT POTENTIAL SPREAD OF VIRUS TO OTHERS (WEAR MASK, WEAR GLOVES, WASH HANDS AND SANITIZE REGULARLY). IF INITIAL TEST IS NEGATIVE, THIS MAY NOT MEAN YOU ARE DEFINITELY NEGATIVE. MOST ACCURATE TESTING IS DONE 5-7 DAYS AFTER EXPOSURE.   It is not advised by CDC to get re-tested after receiving a positive COVID test since you can still test positive for weeks to months after you have already cleared the virus.   *If you have not been vaccinated for COVID, I strongly suggest you consider getting vaccinated as long as there are no contraindications.      ED Prescriptions    None     PDMP not reviewed this encounter.   Shirlee Latch, PA-C 08/10/20 1258    Eusebio Friendly B, PA-C 08/10/20 1258

## 2020-08-08 NOTE — ED Triage Notes (Signed)
Patient in today c/o cough, nasal congestion and headache x 2 days. Patient denies fever. Patient has taken OTC Alka Seltzer last night. Patient was exposed to flu over the Christmas holiday. Patient has had the covid vaccines.

## 2020-08-08 NOTE — Discharge Instructions (Addendum)
+COVID  If symptomatic, go home and rest. Push fluids. Take Tylenol as needed for discomfort. Gargle warm salt water. Throat lozenges. Take Mucinex DM or Robitussin for cough. Humidifier in bedroom to ease coughing. Warm showers. Also review the COVID handout for more information.  COVID-19 INFECTION: The incubation period of COVID-19 is approximately 14 days after exposure, with most symptoms developing in roughly 4-5 days. Symptoms may range in severity from mild to critically severe. Roughly 80% of those infected will have mild symptoms. People of any age may become infected with COVID-19 and have the ability to transmit the virus. The most common symptoms include: fever, fatigue, cough, body aches, headaches, sore throat, nasal congestion, shortness of breath, nausea, vomiting, diarrhea, changes in smell and/or taste.    COURSE OF ILLNESS Some patients may begin with mild disease which can progress quickly into critical symptoms. If your symptoms are worsening please call ahead to the Emergency Department and proceed there for further treatment. Recovery time appears to be roughly 1-2 weeks for mild symptoms and 3-6 weeks for severe disease.   GO IMMEDIATELY TO ER FOR FEVER YOU ARE UNABLE TO GET DOWN WITH TYLENOL, BREATHING PROBLEMS, CHEST PAIN, FATIGUE, LETHARGY, INABILITY TO EAT OR DRINK, ETC  QUARANTINE AND ISOLATION: To help decrease the spread of COVID-19 please remain isolated if you have COVID infection or are highly suspected to have COVID infection. This means -stay home and isolate to one room in the home if you live with others. Do not share a bed or bathroom with others while ill, sanitize and wipe down all countertops and keep common areas clean and disinfected. You may discontinue isolation if you have a mild case and are asymptomatic 10 days after symptom onset as long as you have been fever free >24 hours without having to take Motrin or Tylenol. If your case is more severe (meaning you  develop pneumonia or are admitted in the hospital), you may have to isolate longer.   If you have been in close contact (within 6 feet) of someone diagnosed with COVID 19, you are advised to quarantine in your home for 14 days as symptoms can develop anywhere from 2-14 days after exposure to the virus. If you develop symptoms, you  must isolate.  Most current guidelines for COVID after exposure -isolate 10 days if you ARE NOT tested for COVID as long as symptoms do not develop -isolate 7 days if you are tested and remain asymptomatic -You do not necessarily need to be tested for COVID if you have + exposure and        develop   symptoms. Just isolate at home x10 days from symptom onset During this global pandemic, CDC advises to practice social distancing, try to stay at least 75ft away from others at all times. Wear a face covering. Wash and sanitize your hands regularly and avoid going anywhere that is not necessary.  KEEP IN MIND THAT THE COVID TEST IS NOT 100% ACCURATE AND YOU SHOULD STILL DO EVERYTHING TO PREVENT POTENTIAL SPREAD OF VIRUS TO OTHERS (WEAR MASK, WEAR GLOVES, WASH HANDS AND SANITIZE REGULARLY). IF INITIAL TEST IS NEGATIVE, THIS MAY NOT MEAN YOU ARE DEFINITELY NEGATIVE. MOST ACCURATE TESTING IS DONE 5-7 DAYS AFTER EXPOSURE.   It is not advised by CDC to get re-tested after receiving a positive COVID test since you can still test positive for weeks to months after you have already cleared the virus.   *If you have not been vaccinated for COVID,  I strongly suggest you consider getting vaccinated as long as there are no contraindications.

## 2021-09-08 ENCOUNTER — Ambulatory Visit (INDEPENDENT_AMBULATORY_CARE_PROVIDER_SITE_OTHER): Payer: BC Managed Care – PPO

## 2021-09-08 ENCOUNTER — Other Ambulatory Visit: Payer: Self-pay

## 2021-09-08 ENCOUNTER — Ambulatory Visit
Admission: RE | Admit: 2021-09-08 | Discharge: 2021-09-08 | Disposition: A | Discharge status: Home / Self Care | Payer: BC Managed Care – PPO | Source: Ambulatory Visit | Attending: Internal Medicine | Admitting: Internal Medicine

## 2021-09-08 VITALS — BP 143/88 | HR 82 | Temp 98.5°F | Resp 18 | Ht 70.0 in | Wt 164.9 lb

## 2021-09-08 DIAGNOSIS — R059 Cough, unspecified: Secondary | ICD-10-CM

## 2021-09-08 DIAGNOSIS — R051 Acute cough: Secondary | ICD-10-CM | POA: Insufficient documentation

## 2021-09-08 DIAGNOSIS — R062 Wheezing: Secondary | ICD-10-CM | POA: Diagnosis present

## 2021-09-08 DIAGNOSIS — J40 Bronchitis, not specified as acute or chronic: Secondary | ICD-10-CM | POA: Diagnosis not present

## 2021-09-08 DIAGNOSIS — R509 Fever, unspecified: Secondary | ICD-10-CM

## 2021-09-08 DIAGNOSIS — Z20822 Contact with and (suspected) exposure to covid-19: Secondary | ICD-10-CM | POA: Insufficient documentation

## 2021-09-08 DIAGNOSIS — J069 Acute upper respiratory infection, unspecified: Secondary | ICD-10-CM | POA: Insufficient documentation

## 2021-09-08 LAB — RESP PANEL BY RT-PCR (FLU A&B, COVID) ARPGX2
Influenza A by PCR: NEGATIVE
Influenza B by PCR: NEGATIVE
SARS Coronavirus 2 by RT PCR: NEGATIVE

## 2021-09-08 MED ORDER — BENZONATATE 200 MG PO CAPS: 200.0000 mg | ORAL_CAPSULE | Freq: Three times a day (TID) | ORAL | 0 refills | Status: AC | PRN

## 2021-09-08 MED ORDER — ALBUTEROL SULFATE HFA 108 (90 BASE) MCG/ACT IN AERS: 2.0000 | INHALATION_SPRAY | RESPIRATORY_TRACT | 0 refills | Status: AC | PRN

## 2021-09-08 MED ORDER — AZITHROMYCIN 250 MG PO TABS: ORAL_TABLET | ORAL | 0 refills | Status: AC

## 2021-09-08 NOTE — ED Provider Notes (Addendum)
[facility]    CSN: [id number] Arrival date & time: 09/08/21  1144      History   Chief Complaint Chief Complaint  Patient presents with   Appointment   Cough    HPI Jerome Hart is a 56 y.o. male who presents with cough x 5 days ago and this am had a Fever of 100-101 with body aches and productive cough. Had 2 at home negative covid tests. Denies hx of lung disease.     Past Medical History:  Diagnosis Date   Coronary artery disease    Hyperlipidemia     Patient Active Problem List   Diagnosis Date Noted   Coronary artery disease involving native coronary artery of native heart without angina pectoris 08/11/2017   Elevated blood pressure reading 08/11/2017   Abnormal stress test 07/28/2017   Angina pectoris (HCC) 07/28/2017    Past Surgical History:  Procedure Laterality Date   APPENDECTOMY     CARDIAC SURGERY     TONSILLECTOMY         Home Medications    Prior to Admission medications   Medication Sig Start Date End Date Taking? Authorizing Provider  albuterol (VENTOLIN HFA) 108 (90 Base) MCG/ACT inhaler Inhale 2 puffs into the lungs every 4 (four) hours as needed for wheezing or shortness of breath. 09/08/21  Yes Rodriguez-Southworth, Nettie Elm, PA-C  aspirin 81 MG EC tablet Take 1 tablet by mouth daily. 07/22/17  Yes [provider]  atorvastatin (LIPITOR) 80 MG tablet Take 80 mg by mouth. 07/30/17 09/08/21 Yes [provider]  azithromycin (ZITHROMAX Z-PAK) 250 MG tablet 2 today, then one qd x 4 days 09/08/21  Yes Rodriguez-Southworth, Nettie Elm, PA-C  benzonatate (TESSALON) 200 MG capsule Take 1 capsule (200 mg total) by mouth 3 (three) times daily as needed for cough. 09/08/21  Yes Rodriguez-Southworth, Nettie Elm, PA-C  Multiple Vitamin (MULTI-VITAMIN) tablet Take 1 tablet by mouth daily.   Yes [provider]  nitroGLYCERIN (NITROSTAT) 0.4 MG SL tablet Place 1 tablet (0.4 mg total) under the tongue every 5 (five) minutes as  needed for chest pain. To ED if pain persists after 3 doses 07/21/17   Payton Mccallum, MD    Family History Family History  Problem Relation Age of Onset   Other Mother        natural causes   Cancer Father     Social History Social History   Tobacco Use   Smoking status: Never   Smokeless tobacco: Never  Vaping Use   Vaping Use: Never used  Substance Use Topics   Alcohol use: Yes    Comment: rarely   Drug use: No     Allergies   Prochlorperazine edisylate   Review of Systems Review of Systems  Constitutional:  Positive for activity change, appetite change, chills, fatigue and fever. Negative for diaphoresis.  HENT:  Positive for congestion and rhinorrhea. Negative for ear discharge and ear pain.   Eyes:  Negative for discharge.  Respiratory:  Positive for cough. Negative for chest tightness and shortness of breath.   Cardiovascular:  Negative for chest pain.  Musculoskeletal:  Positive for myalgias.  Skin:  Negative for rash.  Neurological:  Positive for headaches.  Hematological:  Negative for adenopathy.    Physical Exam Triage Vital Signs ED Triage Vitals  Enc Vitals Group     BP 09/08/21 1220 (!) 143/88     Pulse Rate 09/08/21 1220 82     Resp 09/08/21 1220 18  Temp 09/08/21 1220 98.5 F (36.9 C)     Temp Source 09/08/21 1220 Oral     SpO2 09/08/21 1220 97 %     Weight 09/08/21 1218 164 lb 14.5 oz (74.8 kg)     Height 09/08/21 1218 5\' 10"  (1.778 m)     Head Circumference --      Peak Flow --      Pain Score 09/08/21 1217 4     Pain Loc --      Pain Edu? --      Excl. in GC? --    No data found.  Updated Vital Signs BP (!) 143/88 (BP Location: Right Arm)    Pulse 82    Temp 98.5 F (36.9 C) (Oral)    Resp 18    Ht 5\' 10"  (1.778 m)    Wt 164 lb 14.5 oz (74.8 kg)    SpO2 97%    BMI 23.66 kg/m   Visual Acuity Right Eye Distance:   Left Eye Distance:   Bilateral Distance:    Right Eye Near:   Left Eye Near:    Bilateral Near:      Physical Exam Vitals and nursing note reviewed.  Constitutional:      Appearance: He is ill-appearing. He is not toxic-appearing.  HENT:     Right Ear: Tympanic membrane, ear canal and external ear normal.     Left Ear: Tympanic membrane, ear canal and external ear normal.     Mouth/Throat:     Mouth: Mucous membranes are moist.     Pharynx: Oropharynx is clear.  Eyes:     General: No scleral icterus.    Conjunctiva/sclera: Conjunctivae normal.  Cardiovascular:     Rate and Rhythm: Normal rate and regular rhythm.     Heart sounds: No murmur heard. Pulmonary:     Effort: Pulmonary effort is normal.     Breath sounds: Wheezing present.     Comments: In L lung Musculoskeletal:        General: Normal range of motion.     Cervical back: Neck supple.  Lymphadenopathy:     Cervical: No cervical adenopathy.  Skin:    General: Skin is warm and dry.     Findings: No rash.  Neurological:     Mental Status: He is alert and oriented to person, place, and time.     Gait: Gait normal.  Psychiatric:        Mood and Affect: Mood normal.        Behavior: Behavior normal.        Thought Content: Thought content normal.        Judgment: Judgment normal.         UC Treatments / Results  Labs (all labs ordered are listed, but only abnormal results are displayed) Labs Reviewed  RESP PANEL BY RT-PCR (FLU A&B, COVID) [id number]  Negative Flu A&B and Covid test   EKG   Radiology DG Chest 2 View  Result Date: 09/08/2021 CLINICAL DATA:  Cough, fever EXAM: CHEST - 2 VIEW COMPARISON:  None. FINDINGS: Normal heart size. Normal mediastinal contour. No pneumothorax. No pleural effusion. Lungs appear clear, with no acute consolidative airspace disease and no pulmonary edema. IMPRESSION: No active cardiopulmonary disease. Electronically Signed   By: Delbert PhenixJason A Poff M.D.   On: 09/08/2021 13:27    Procedures Procedures (including critical care time)  Medications Ordered in UC Medications - No data  to display  Initial Impression / Assessment and  Plan / UC Course  I have reviewed the triage vital signs and the nursing notes. Pertinent labs &  imaging results that were available during my care of the patient were reviewed by me and considered in my medical decision making (see chart for details). Has bronchitis. I placed him on Tessalon, Zpack and Albuterol inhaler as noted.    Final Clinical Impressions(s) / UC Diagnoses   Final diagnoses:  Viral URI with cough  Wheezing  Bronchitis   Discharge Instructions   None    ED Prescriptions     Medication Sig Dispense Auth. Provider   albuterol (VENTOLIN HFA) 108 (90 Base) MCG/ACT inhaler Inhale 2 puffs into the lungs every 4 (four) hours as needed for wheezing or shortness of breath. 18 g Rodriguez-Southworth, Johnston Maddocks, PA-C   azithromycin (ZITHROMAX Z-PAK) 250 MG tablet 2 today, then one qd x 4 days 6 tablet Rodriguez-Southworth, Salayah Meares, PA-C   benzonatate (TESSALON) 200 MG capsule Take 1 capsule (200 mg total) by mouth 3 (three) times daily as needed for cough. 30 capsule Rodriguez-Southworth, Nettie Elm, PA-C      PDMP not reviewed this encounter.   Garey Ham, PA-C 09/08/21 1508    Rodriguez-Southworth, Nettie Elm, PA-C 09/08/21 [phone]

## 2021-09-08 NOTE — ED Triage Notes (Signed)
Pt c/o cough, nasal congestion, runny nose, and fever (100-101). Started about 5 days ago. 2 at home covid test negative.

## 2022-12-12 IMAGING — CR DG CHEST 2V
3 series · 3 of 3 positions shown · non-contrast
Comparison: None.

CLINICAL DATA: Cough, fever

EXAM:
CHEST - 2 VIEW

[chest pa (1 of 2)]
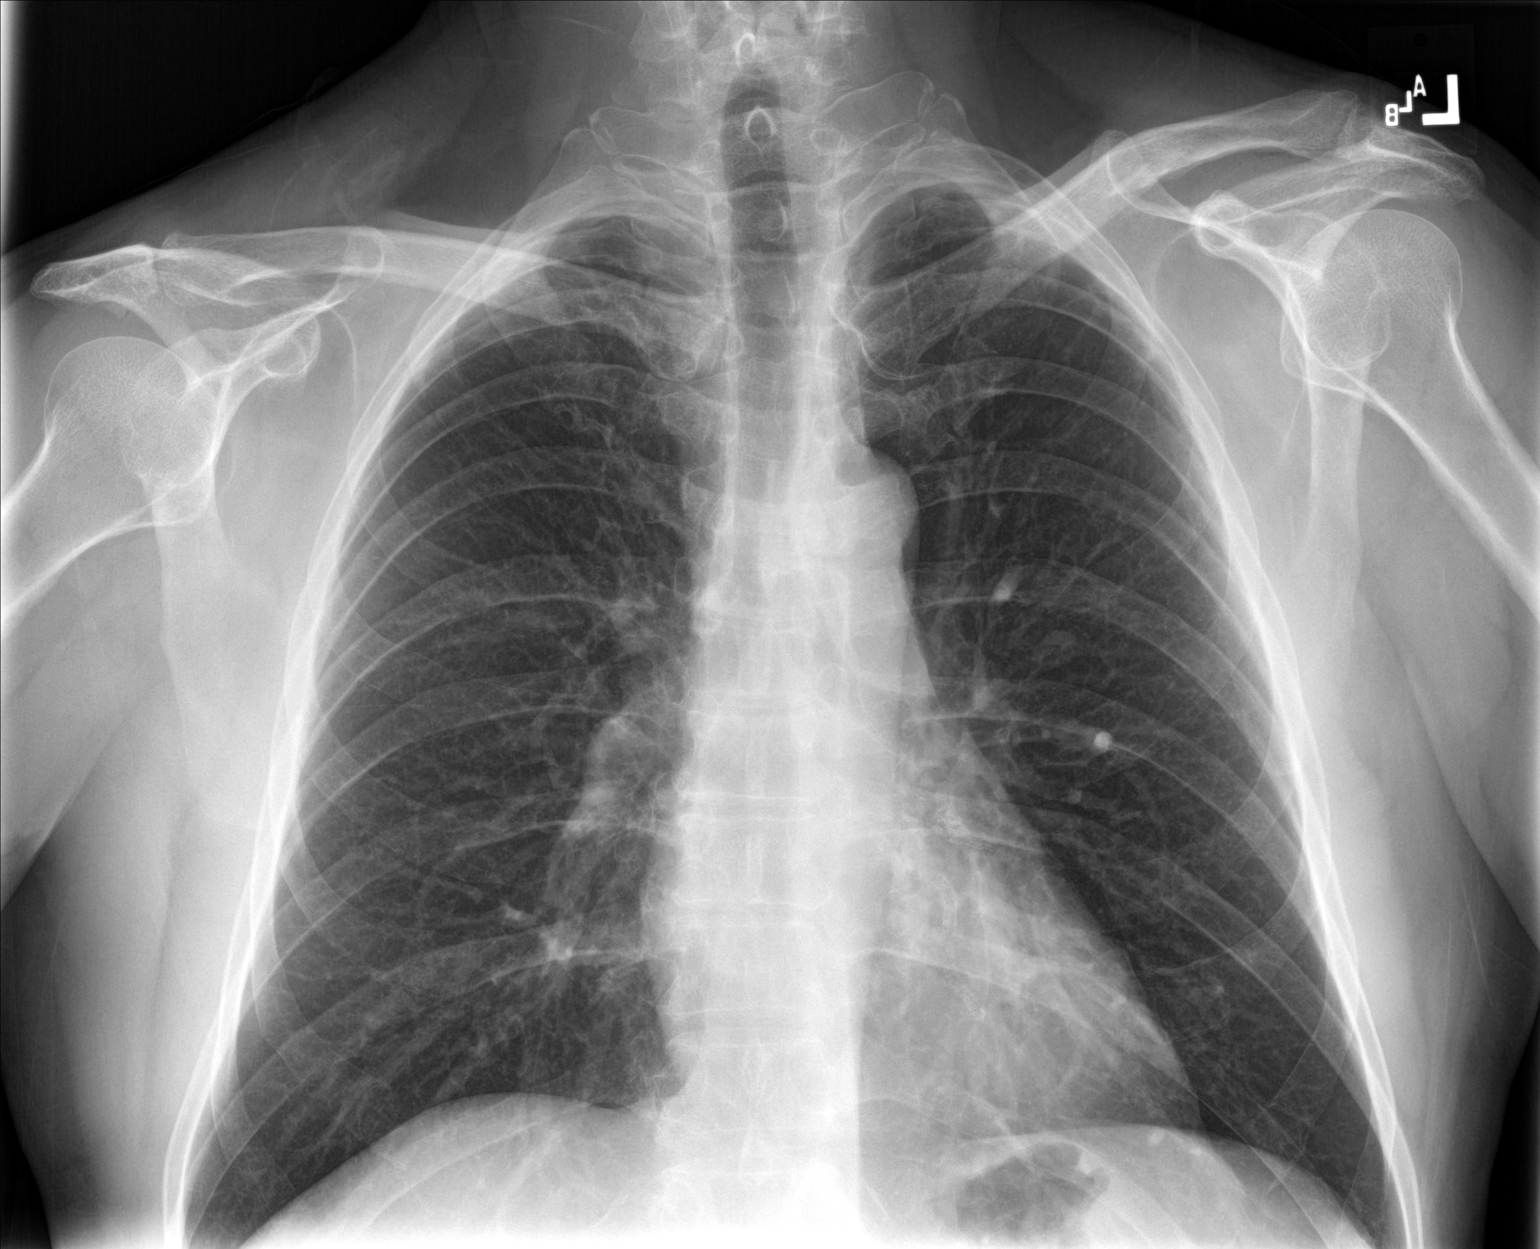

[chest lat]
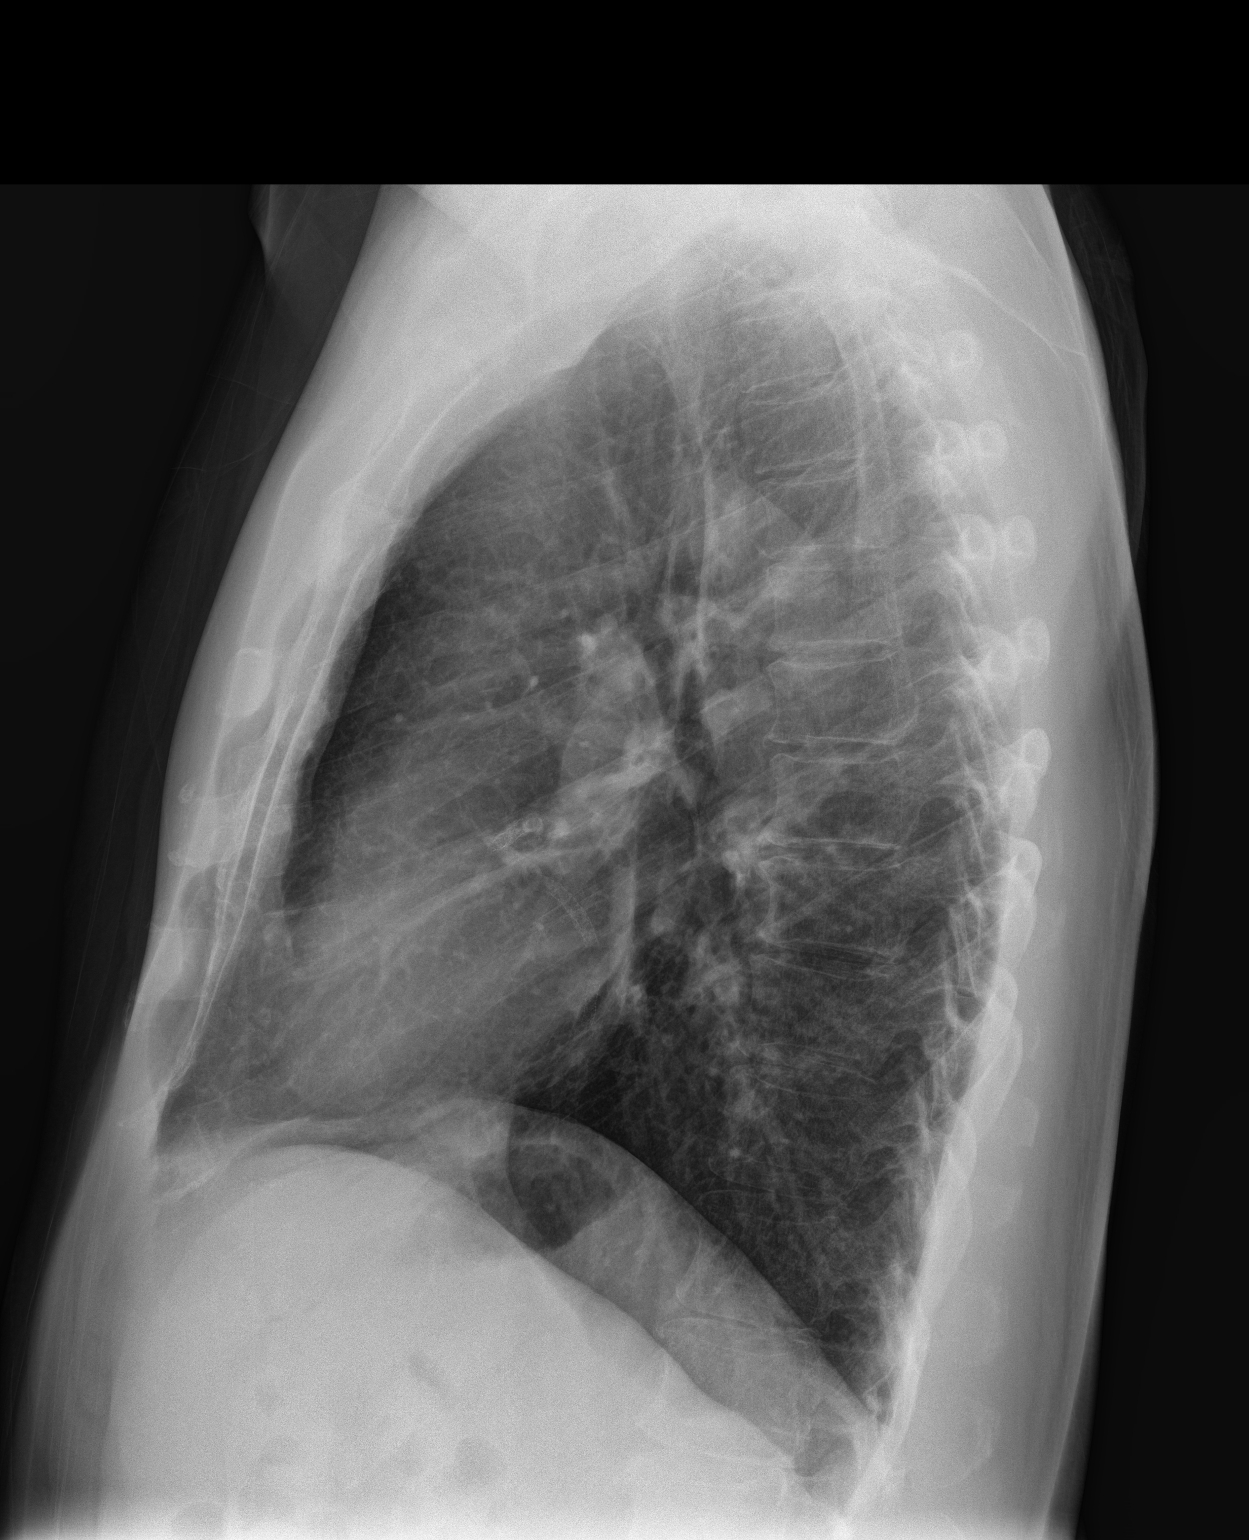

[chest pa (2 of 2)]
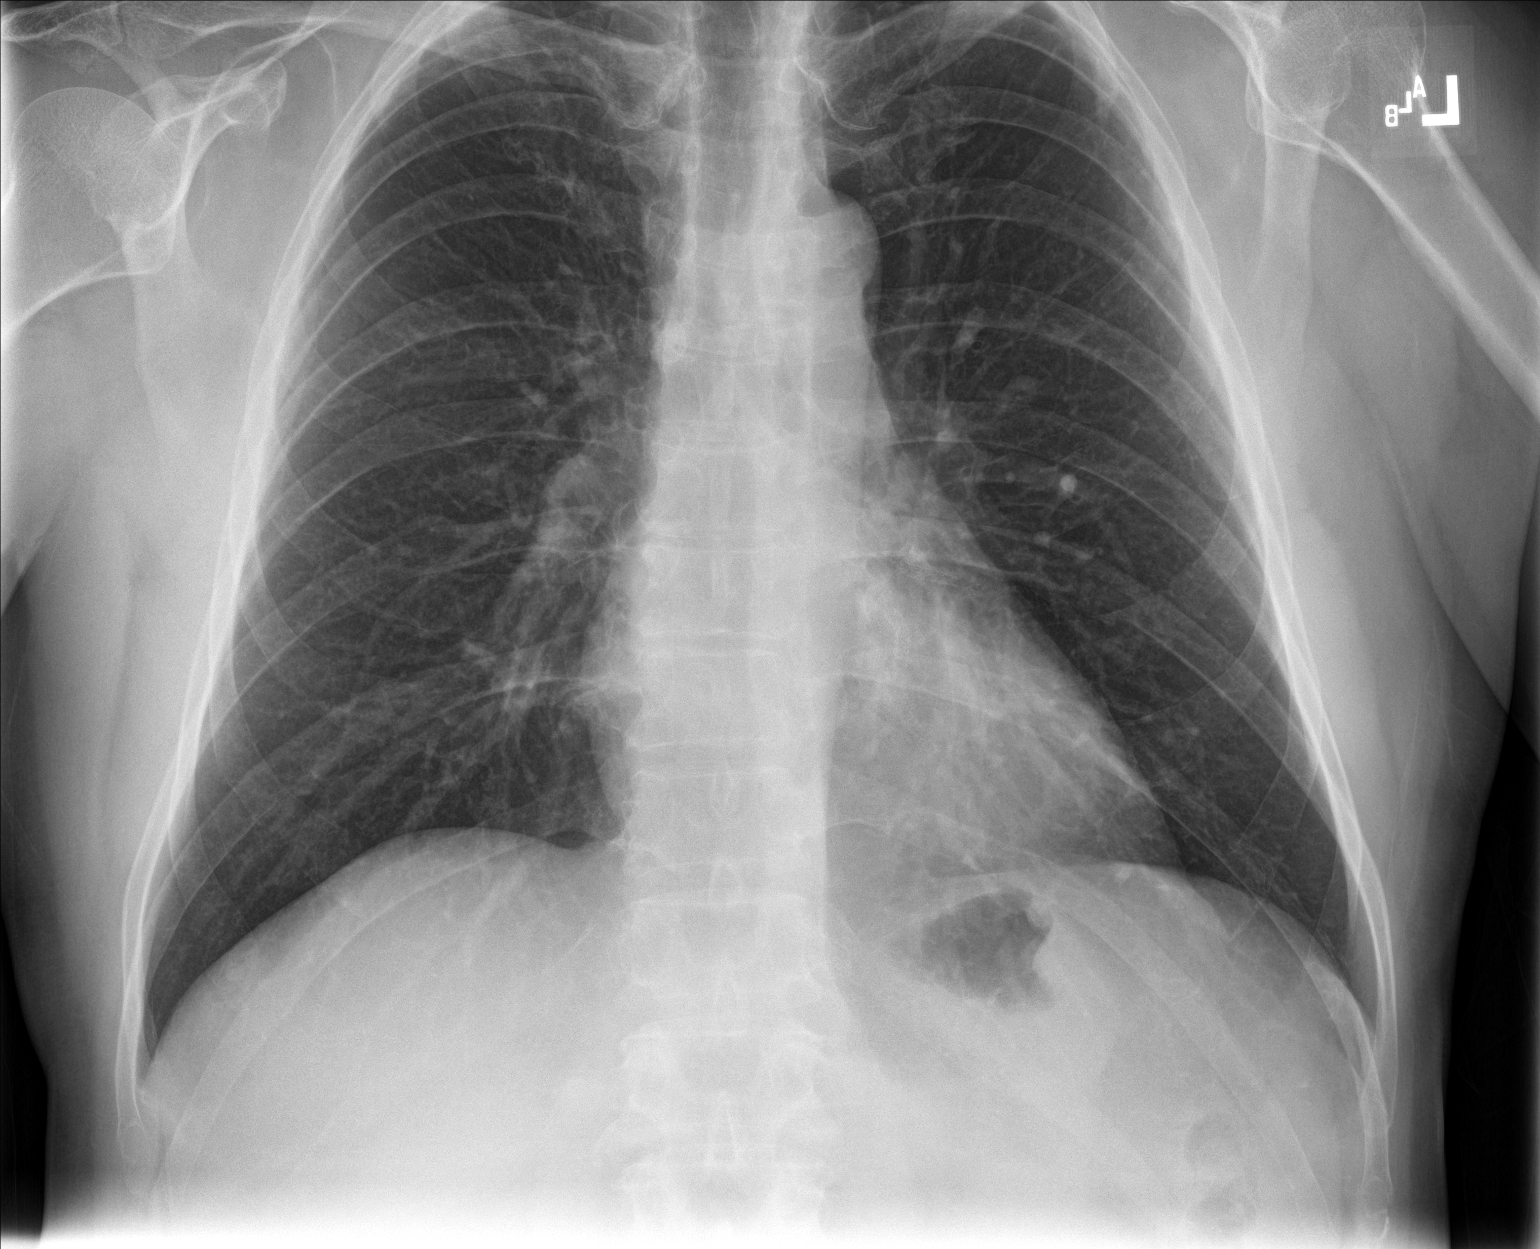

[3 of 3 positions shown; findings below may reference images not displayed]

FINDINGS: Normal heart size. Normal mediastinal contour. No pneumothorax. No
pleural effusion. Lungs appear clear, with no acute consolidative
airspace disease and no pulmonary edema.
IMPRESSION: No active cardiopulmonary disease.
